# Patient Record
Sex: Male | Born: 1970 | Hispanic: Yes | Marital: Single | Smoking: Never smoker
Health system: Southern US, Community
[De-identification: ages and names within clinical notes are randomized; demographics above are authoritative.]

## PROBLEM LIST (undated history)

## (undated) DIAGNOSIS — K649 Unspecified hemorrhoids: Secondary | ICD-10-CM

## (undated) HISTORY — PX: HEMORRHOID SURGERY: SHX153

---

## 2010-10-16 ENCOUNTER — Ambulatory Visit (HOSPITAL_COMMUNITY): Admission: RE | Admit: 2010-10-16 | Discharge: 2010-10-16 | Payer: Self-pay | Admitting: Emergency Medicine

## 2015-06-07 ENCOUNTER — Emergency Department (HOSPITAL_COMMUNITY)
Admission: EM | Admit: 2015-06-07 | Discharge: 2015-06-07 | Disposition: A | Discharge status: Home / Self Care | Payer: Self-pay | Attending: Emergency Medicine | Admitting: Emergency Medicine

## 2015-06-07 ENCOUNTER — Encounter (HOSPITAL_COMMUNITY): Payer: Self-pay | Admitting: Emergency Medicine

## 2015-06-07 DIAGNOSIS — K644 Residual hemorrhoidal skin tags: Secondary | ICD-10-CM | POA: Insufficient documentation

## 2015-06-07 DIAGNOSIS — K611 Rectal abscess: Secondary | ICD-10-CM | POA: Insufficient documentation

## 2015-06-07 MED ORDER — SULFAMETHOXAZOLE-TRIMETHOPRIM 800-160 MG PO TABS
1.0000 | ORAL_TABLET | Freq: Once | ORAL | Status: AC
  Administered 2015-06-07: 1 via ORAL
  Filled 2015-06-07: qty 1

## 2015-06-07 MED ORDER — SULFAMETHOXAZOLE-TRIMETHOPRIM 800-160 MG PO TABS: 1.0000 | ORAL_TABLET | Freq: Two times a day (BID) | ORAL | Status: DC

## 2015-06-07 NOTE — Discharge Instructions (Signed)
As discussed, for the next 5 days that is important that he use Epsom salts baths, 3 times daily in addition to maintaining good hygiene practices.  Please take all medication as directed.  If your wound continues to enlarge, rather than decrease in size, please be sure to follow-up with our surgical colleagues.

## 2015-06-07 NOTE — ED Notes (Signed)
Pt. reports hemorhoid pain with swelling onset this week , denies rectal bleeding , no fever .

## 2015-06-08 NOTE — ED Provider Notes (Signed)
CSN: [id number]     Arrival date & time 06/07/15  2125 History   First MD Initiated Contact with Patient 06/07/15 2220     Chief Complaint  Patient presents with  . Hemorrhoids     (Consider location/radiation/quality/duration/timing/severity/associated sxs/prior Treatment) HPI  Patient presents with concern of perirectal irritation. Onset was about one week ago, since that time he has had a firm, irritated area to the right of his rectum. No fever, chills, changes in bowel movements. Patient acknowledges a history of hemorrhoids, denies any changes in those, specifically no bleeding, swelling. No abdominal pain. Patient says that he has a history of perirectal abscess in the distant past, has some concern about recurrence. He has not yet spoken with primary care or [facility]. Areas sore, not severe, moderate, no relief with OTC medication  History reviewed. No pertinent past medical history. Past Surgical History  Procedure Laterality Date  . Hemorrhoid surgery     No family history on file. History  Substance Use Topics  . Smoking status: Never Smoker   . Smokeless tobacco: Not on file  . Alcohol Use: No    Review of Systems  Constitutional: Negative for fever and chills.  Respiratory: Negative for shortness of breath.   Cardiovascular: Negative for chest pain.  Gastrointestinal: Negative for abdominal pain, blood in stool, abdominal distention and rectal pain.       Hemorrhoids, patient denies any active bleeding, swelling, rectal pain.  Musculoskeletal:       Negative aside from HPI  Skin:       Negative aside from HPI  Allergic/Immunologic: Negative for immunocompromised state.  Neurological: Negative for weakness.      Allergies  Review of patient's allergies indicates no known allergies.  Home Medications   Prior to Admission medications   Medication Sig Start Date End Date Taking? Authorizing Provider  ibuprofen (ADVIL,MOTRIN) 200 MG  tablet Take 400 mg by mouth every 6 (six) hours as needed for mild pain.   Yes Historical Provider, MD  OVER THE COUNTER MEDICATION Apply 1 application topically daily as needed (hemmroidal pain).   Yes Historical Provider, MD  sulfamethoxazole-trimethoprim (BACTRIM DS,SEPTRA DS) 800-160 MG per tablet Take 1 tablet by mouth 2 (two) times daily. 06/08/15 06/14/15  Gerhard Munchobert Merlyn Bollen, MD   BP 137/62 mmHg  Pulse 82  Temp(Src) 99 F (37.2 C)  Resp 16  SpO2 100% Physical Exam  Constitutional: He is oriented to person, place, and time. He appears well-developed. No distress.  HENT:  Head: Normocephalic and atraumatic.  Eyes: Conjunctivae and EOM are normal.  Cardiovascular: Normal rate and regular rhythm.   Pulmonary/Chest: Effort normal. No stridor. No respiratory distress.  Abdominal: He exhibits no distension.  Genitourinary: Rectal exam shows external hemorrhoid. Rectal exam shows no fissure.     Musculoskeletal: He exhibits no edema.  Neurological: He is alert and oriented to person, place, and time.  Skin: Skin is warm and dry.  Psychiatric: He has a normal mood and affect.  Nursing note and vitals reviewed.   ED Course  Procedures (including critical care time) Labs Review Labs Reviewed - No data to display  Imaging Review No results found.   EKG Interpretation None      MDM   Final diagnoses:  Peri-rectal abscess   Patient presents with a very early, by clinical exam, perirectal infection. No drainable collection. With a lengthy conversation about absence of baths, empiric antibiotics, close follow-up, probably with surgery. With no drainable collection, no evidence  for bacteremia or sepsis, patient was discharged in stable condition to follow-up as an outpatient.  Gerhard Munch, MD 06/08/15 [phone]

## 2015-06-11 ENCOUNTER — Inpatient Hospital Stay (HOSPITAL_COMMUNITY)
Admission: EM | Admit: 2015-06-11 | Discharge: 2015-06-13 | DRG: 395 | Disposition: A | Discharge status: Home Health | Payer: Medicaid Other | Attending: Surgery | Admitting: Surgery

## 2015-06-11 ENCOUNTER — Encounter (HOSPITAL_COMMUNITY): Payer: Self-pay | Admitting: Emergency Medicine

## 2015-06-11 ENCOUNTER — Emergency Department (HOSPITAL_COMMUNITY): Payer: Medicaid Other

## 2015-06-11 DIAGNOSIS — K611 Rectal abscess: Principal | ICD-10-CM | POA: Diagnosis present

## 2015-06-11 DIAGNOSIS — L0291 Cutaneous abscess, unspecified: Secondary | ICD-10-CM | POA: Diagnosis present

## 2015-06-11 HISTORY — DX: Unspecified hemorrhoids: K64.9

## 2015-06-11 LAB — CBC WITH DIFFERENTIAL/PLATELET
Basophils Absolute: 0 10*3/uL (ref 0.0–0.1)
Basophils Absolute: 0 10*3/uL (ref 0.0–0.1)
Basophils Relative: 0 % (ref 0–1)
Basophils Relative: 0 % (ref 0–1)
Eosinophils Absolute: 0.1 10*3/uL (ref 0.0–0.7)
Eosinophils Absolute: 0.1 10*3/uL (ref 0.0–0.7)
Eosinophils Relative: 1 % (ref 0–5)
Eosinophils Relative: 1 % (ref 0–5)
HCT: 44.2 % (ref 39.0–52.0)
HCT: 41.8 % (ref 39.0–52.0)
Hemoglobin: 15.3 g/dL (ref 13.0–17.0)
Hemoglobin: 14.2 g/dL (ref 13.0–17.0)
Lymphocytes Relative: 11 % — ABNORMAL LOW (ref 12–46)
Lymphocytes Relative: 15 % (ref 12–46)
Lymphs Abs: 1.4 10*3/uL (ref 0.7–4.0)
Lymphs Abs: 1.8 10*3/uL (ref 0.7–4.0)
MCH: 31.3 pg (ref 26.0–34.0)
MCH: 30.7 pg (ref 26.0–34.0)
MCHC: 34.6 g/dL (ref 30.0–36.0)
MCHC: 34 g/dL (ref 30.0–36.0)
MCV: 90.4 fL (ref 78.0–100.0)
MCV: 90.5 fL (ref 78.0–100.0)
MONOS PCT: 9 % (ref 3–12)
Monocytes Absolute: 1.1 10*3/uL — ABNORMAL HIGH (ref 0.1–1.0)
Monocytes Absolute: 1.1 10*3/uL — ABNORMAL HIGH (ref 0.1–1.0)
Monocytes Relative: 9 % (ref 3–12)
NEUTROS ABS: 10.1 10*3/uL — AB (ref 1.7–7.7)
NEUTROS PCT: 79 % — AB (ref 43–77)
Neutro Abs: 9.1 10*3/uL — ABNORMAL HIGH (ref 1.7–7.7)
Neutrophils Relative %: 75 % (ref 43–77)
PLATELETS: 351 10*3/uL (ref 150–400)
PLATELETS: 370 10*3/uL (ref 150–400)
RBC: 4.89 MIL/uL (ref 4.22–5.81)
RBC: 4.62 MIL/uL (ref 4.22–5.81)
RDW: 12.5 % (ref 11.5–15.5)
RDW: 12.5 % (ref 11.5–15.5)
WBC: 12.7 10*3/uL — ABNORMAL HIGH (ref 4.0–10.5)
WBC: 12.2 10*3/uL — ABNORMAL HIGH (ref 4.0–10.5)

## 2015-06-11 LAB — BASIC METABOLIC PANEL
ANION GAP: 10 (ref 5–15)
BUN: 9 mg/dL (ref 6–20)
CHLORIDE: 104 mmol/L (ref 101–111)
CO2: 26 mmol/L (ref 22–32)
CREATININE: 0.94 mg/dL (ref 0.61–1.24)
Calcium: 9.3 mg/dL (ref 8.9–10.3)
GFR calc Af Amer: >60 mL/min (ref >60)
GFR calc non Af Amer: >60 mL/min (ref >60)
Glucose, Bld: 103 mg/dL — ABNORMAL HIGH (ref 65–99)
Potassium: 4.8 mmol/L (ref 3.5–5.1)
SODIUM: 140 mmol/L (ref 135–145)

## 2015-06-11 MED ORDER — HYDROMORPHONE HCL 1 MG/ML IJ SOLN
1.0000 mg | Freq: Once | INTRAMUSCULAR | Status: AC
  Administered 2015-06-11: 1 mg via INTRAVENOUS
  Filled 2015-06-11: qty 1

## 2015-06-11 MED ORDER — VANCOMYCIN HCL IN DEXTROSE 1-5 GM/200ML-% IV SOLN
1000.0000 mg | Freq: Once | INTRAVENOUS | Status: AC
  Administered 2015-06-11: 1000 mg via INTRAVENOUS
  Filled 2015-06-11: qty 200

## 2015-06-11 MED ORDER — HYDROMORPHONE HCL 1 MG/ML IJ SOLN
1.0000 mg | INTRAMUSCULAR | Status: AC | PRN
  Administered 2015-06-11: 1 mg via INTRAVENOUS
  Filled 2015-06-11: qty 1

## 2015-06-11 MED ORDER — PROPOFOL 10 MG/ML IV BOLUS
INTRAVENOUS | Status: AC
  Filled 2015-06-11: qty 20

## 2015-06-11 MED ORDER — ONDANSETRON HCL 4 MG/2ML IJ SOLN
4.0000 mg | Freq: Once | INTRAMUSCULAR | Status: AC
  Administered 2015-06-11: 4 mg via INTRAVENOUS
  Filled 2015-06-11: qty 2

## 2015-06-11 MED ORDER — SODIUM CHLORIDE 0.9 % IV SOLN
INTRAVENOUS | Status: AC
  Administered 2015-06-11: 19:00:00 via INTRAVENOUS

## 2015-06-11 MED ORDER — SODIUM CHLORIDE 0.9 % IV SOLN
3.0000 g | Freq: Four times a day (QID) | INTRAVENOUS | Status: DC
  Administered 2015-06-11 – 2015-06-13 (×7): 3 g via INTRAVENOUS
  Filled 2015-06-11 (×9): qty 3

## 2015-06-11 MED ORDER — FENTANYL CITRATE (PF) 250 MCG/5ML IJ SOLN
INTRAMUSCULAR | Status: AC
  Filled 2015-06-11: qty 5

## 2015-06-11 MED ORDER — IOHEXOL 300 MG/ML  SOLN
100.0000 mL | Freq: Once | INTRAMUSCULAR | Status: AC | PRN
  Administered 2015-06-11: 100 mL via INTRAVENOUS

## 2015-06-11 MED ORDER — MIDAZOLAM HCL 2 MG/2ML IJ SOLN
INTRAMUSCULAR | Status: AC
  Filled 2015-06-11: qty 2

## 2015-06-11 MED ORDER — LIDOCAINE-EPINEPHRINE (PF) 2 %-1:200000 IJ SOLN
10.0000 mL | Freq: Once | INTRAMUSCULAR | Status: AC
  Administered 2015-06-11: 10 mL
  Filled 2015-06-11: qty 20

## 2015-06-11 MED ORDER — MORPHINE SULFATE 4 MG/ML IJ SOLN
4.0000 mg | INTRAMUSCULAR | Status: DC | PRN
  Administered 2015-06-12: 4 mg via INTRAVENOUS
  Filled 2015-06-11: qty 1

## 2015-06-11 MED ORDER — ONDANSETRON HCL 4 MG/2ML IJ SOLN: 4.0000 mg | Freq: Four times a day (QID) | INTRAMUSCULAR | Status: DC | PRN

## 2015-06-11 NOTE — Anesthesia Preprocedure Evaluation (Addendum)
Anesthesia Evaluation  Patient identified by MRN, date of birth, ID band Patient awake    Reviewed: Allergy & Precautions, NPO status , Patient's Chart, lab work & pertinent test results  Airway Mallampati: II  TM Distance: >3 FB Neck ROM: Full    Dental no notable dental hx.    Pulmonary neg pulmonary ROS,  breath sounds clear to auscultation  Pulmonary exam normal       Cardiovascular negative cardio ROS Normal cardiovascular examRhythm:Regular Rate:Normal     Neuro/Psych negative neurological ROS  negative psych ROS   GI/Hepatic negative GI ROS, Neg liver ROS,   Endo/Other  negative endocrine ROS  Renal/GU negative Renal ROS     Musculoskeletal negative musculoskeletal ROS (+)   Abdominal   Peds  Hematology negative hematology ROS (+)   Anesthesia Other Findings   Reproductive/Obstetrics                             Anesthesia Physical Anesthesia Plan  ASA: II  Anesthesia Plan: General   Post-op Pain Management:    Induction: Intravenous  Airway Management Planned: Oral ETT and LMA  Additional Equipment: None  Intra-op Plan:   Post-operative Plan: Extubation in OR  Informed Consent: I have reviewed the patients History and Physical, chart, labs and discussed the procedure including the risks, benefits and alternatives for the proposed anesthesia with the patient or authorized representative who has indicated his/her understanding and acceptance.   Dental advisory given  Plan Discussed with: CRNA  Anesthesia Plan Comments:        Anesthesia Quick Evaluation

## 2015-06-11 NOTE — ED Provider Notes (Signed)
THIS IS A SHARED VISIT WITH Hinton LovelyOBERT BROWING, [facility]  Patient awaiting surgeon. Dr. Corliss Skainssuei her to see the patient and it will be approximately 3 hours before he can go to the OR. He request consent form and start IV antibiotics. Will use language line to obtain consent and start Vancomycin 1 gram IV. Or will call when ready for patient. Patient resting comfortably and abscess is draining.  BP 139/72 mmHg  Pulse 80  Temp(Src) 98.2 F (36.8 C) (Oral)  Resp 16  Ht 5' 6.93" (1.7 m)  Wt 150 lb (68.04 kg)  BMI 23.54 kg/m2  SpO2 100%      [facility] Jurell Basista, NP 06/11/15 1645  Doug SouSam Jacubowitz, MD 06/11/15 1705

## 2015-06-11 NOTE — Progress Notes (Signed)
Patient ID: [name], male   DOB: [birth year], 44 y.o.   MRN: [id number]  Patient's abscess has spontaneously been draining.  Surgery has been delayed by multiple emergent trauma cases, so the case will be postponed until tomorrow.  Wilmon ArmsMatthew K. Corliss Skainssuei, MD, [facility]  General/ Trauma Surgery  06/11/2015 10:47 PM

## 2015-06-11 NOTE — ED Notes (Signed)
Pt reports having hemorrhoids for the past 5 years.He states there are 3 of them and they are very painful. No drainage noted by pt. He has had them removed one time before without any anesthesia and c/o severe pain. Pt is spanish speaking. Telephone interpreter used. Daughter at bedside.

## 2015-06-11 NOTE — ED Provider Notes (Addendum)
Patient appears uncomfortable Patient with draining abscess at right buttock. Approximately tennis ball sized. Tender.   Doug SouSam Kayani Rapaport, MD 06/11/15 1415 She was offered incision and drainage in the emergency department versus in the operating room he prefers to go to the operating room given had similar episode 5 years ago and he reported exquisite pain. I spoke with Dr.Tsuei plan 23 hour observation intravenous antibiotics nothing by mouth he will go to the operating room later today. Results for orders placed or performed during the hospital encounter of 06/11/15  CBC with Differential/Platelet  Result Value Ref Range   WBC 12.7 (H) 4.0 - 10.5 K/uL   RBC 4.89 4.22 - 5.81 MIL/uL   Hemoglobin 15.3 13.0 - 17.0 g/dL   HCT 09.844.2 11.939.0 - 14.752.0 %   MCV 90.4 78.0 - 100.0 fL   MCH 31.3 26.0 - 34.0 pg   MCHC 34.6 30.0 - 36.0 g/dL   RDW 82.912.5 56.211.5 - 13.015.5 %   Platelets 351 150 - 400 K/uL   Neutrophils Relative % 79 (H) 43 - 77 %   Neutro Abs 10.1 (H) 1.7 - 7.7 K/uL   Lymphocytes Relative 11 (L) 12 - 46 %   Lymphs Abs 1.4 0.7 - 4.0 K/uL   Monocytes Relative 9 3 - 12 %   Monocytes Absolute 1.1 (H) 0.1 - 1.0 K/uL   Eosinophils Relative 1 0 - 5 %   Eosinophils Absolute 0.1 0.0 - 0.7 K/uL   Basophils Relative 0 0 - 1 %   Basophils Absolute 0.0 0.0 - 0.1 K/uL  Basic metabolic panel  Result Value Ref Range   Sodium 140 135 - 145 mmol/L   Potassium 4.8 3.5 - 5.1 mmol/L   Chloride 104 101 - 111 mmol/L   CO2 26 22 - 32 mmol/L   Glucose, Bld 103 (H) 65 - 99 mg/dL   BUN 9 6 - 20 mg/dL   Creatinine, Ser 8.650.94 0.61 - 1.24 mg/dL   Calcium 9.3 8.9 - 78.410.3 mg/dL   GFR calc non Af Amer >60 >60 mL/min   GFR calc Af Amer >60 >60 mL/min   Anion gap 10 5 - 15   Ct Pelvis W Contrast  06/11/2015   CLINICAL DATA:  Rectal abscess, rectal pain for 1 week, foul-smelling drainage from rectal region  EXAM: CT PELVIS WITH CONTRAST  TECHNIQUE: Multidetector CT imaging of the pelvis was performed using the standard  protocol following the bolus administration of intravenous contrast. Sagittal and coronal MPR images reconstructed from axial data set.  CONTRAST:  100mL OMNIPAQUE IOHEXOL 300 MG/ML  SOLN IV  COMPARISON:  None.  FINDINGS: Bladder induced to ureters normal appearance.  Visualized bowel loops and portion of appendix normal appearance.  No acute intrapelvic abnormalities.  Infiltrative changes identified in the medial aspect of the RIGHT buttock compatible with an inflammatory process.  In addition, a focal gas and fluid collection with an enhancing margin is identified in the medial aspect of the RIGHT buttock compatible with abscess, overall measuring 5.0 x 2.0 x 6.2 cm in size.  No intrapelvic extension of this inflammatory process is identified.  No additional gas or fluid collections.  Small LEFT inguinal hernia.  Osseous structures unremarkable.  IMPRESSION: Soft tissue abscess identified at the medial aspect of RIGHT buttock 5.0 x 2.0 x 6.2 cm in size with surrounding inflammatory changes.  No intrapelvic extension of the inflammatory process is seen.  Small LEFT inguinal hernia.   Electronically Signed   By: Loraine LericheMark  Tyron Russell M.D.   On: 06/11/2015 13:42     Doug Sou, MD 06/11/15 [phone]

## 2015-06-11 NOTE — ED Notes (Signed)
Patient transported to CT 

## 2015-06-11 NOTE — H&P (Signed)
Joshua Patrick is an 44 y.o. male.   Chief Complaint: Buttock abscess  HPI: 44 yo hispanic male - speaks little [place] - presents with one week history of worsening pain and swelling on medial right buttock.  He was seen in the ED three days ago and was given Bactrim.  The swelling and pain has worsened, so he returned today.  Some foul smelling purulent drainage is coming out of the abscess now.  The ED PA ordered a pelvic CT scan.  Past Medical History  Diagnosis Date  . Hemorrhoids     Past Surgical History  Procedure Laterality Date  . Hemorrhoid surgery      No family history on file. Social History:  reports that he has never smoked. He does not have any smokeless tobacco history on file. He reports that he does not drink alcohol or use illicit drugs.  Allergies: No Known Allergies  Prior to Admission medications   Medication Sig Start Date End Date Taking? Authorizing Provider  ibuprofen (ADVIL,MOTRIN) 200 MG tablet Take 400 mg by mouth every 6 (six) hours as needed for mild pain.   Yes Historical Provider, MD  OVER THE COUNTER MEDICATION Apply 1 application topically daily as needed (hemmroidal pain).   Yes Historical Provider, MD  sulfamethoxazole-trimethoprim (BACTRIM DS,SEPTRA DS) 800-160 MG per tablet Take 1 tablet by mouth 2 (two) times daily. 06/08/15 06/14/15 Yes Carmin Muskrat, MD    Results for orders placed or performed during the hospital encounter of 06/11/15 (from the past 48 hour(s))  CBC with Differential/Platelet     Status: Abnormal   Collection Time: 06/11/15 12:44 PM  Result Value Ref Range   WBC 12.7 (H) 4.0 - 10.5 K/uL   RBC 4.89 4.22 - 5.81 MIL/uL   Hemoglobin 15.3 13.0 - 17.0 g/dL   HCT 44.2 39.0 - 52.0 %   MCV 90.4 78.0 - 100.0 fL   MCH 31.3 26.0 - 34.0 pg   MCHC 34.6 30.0 - 36.0 g/dL   RDW 12.5 11.5 - 15.5 %   Platelets 351 150 - 400 K/uL   Neutrophils Relative % 79 (H) 43 - 77 %   Neutro Abs 10.1 (H) 1.7 - 7.7 K/uL   Lymphocytes Relative  11 (L) 12 - 46 %   Lymphs Abs 1.4 0.7 - 4.0 K/uL   Monocytes Relative 9 3 - 12 %   Monocytes Absolute 1.1 (H) 0.1 - 1.0 K/uL   Eosinophils Relative 1 0 - 5 %   Eosinophils Absolute 0.1 0.0 - 0.7 K/uL   Basophils Relative 0 0 - 1 %   Basophils Absolute 0.0 0.0 - 0.1 K/uL  Basic metabolic panel     Status: Abnormal   Collection Time: 06/11/15 12:44 PM  Result Value Ref Range   Sodium 140 135 - 145 mmol/L   Potassium 4.8 3.5 - 5.1 mmol/L   Chloride 104 101 - 111 mmol/L   CO2 26 22 - 32 mmol/L   Glucose, Bld 103 (H) 65 - 99 mg/dL   BUN 9 6 - 20 mg/dL   Creatinine, Ser 0.94 0.61 - 1.24 mg/dL   Calcium 9.3 8.9 - 10.3 mg/dL   GFR calc non Af Amer >60 >60 mL/min   GFR calc Af Amer >60 >60 mL/min    Comment: (NOTE) The eGFR has been calculated using the CKD EPI equation. This calculation has not been validated in all clinical situations. eGFR's persistently <60 mL/min signify possible Chronic Kidney Disease.    Anion gap  10 5 - 15   Ct Pelvis W Contrast  06/11/2015   CLINICAL DATA:  Rectal abscess, rectal pain for 1 week, foul-smelling drainage from rectal region  EXAM: CT PELVIS WITH CONTRAST  TECHNIQUE: Multidetector CT imaging of the pelvis was performed using the standard protocol following the bolus administration of intravenous contrast. Sagittal and coronal MPR images reconstructed from axial data set.  CONTRAST:  110m OMNIPAQUE IOHEXOL 300 MG/ML  SOLN IV  COMPARISON:  None.  FINDINGS: Bladder induced to ureters normal appearance.  Visualized bowel loops and portion of appendix normal appearance.  No acute intrapelvic abnormalities.  Infiltrative changes identified in the medial aspect of the RIGHT buttock compatible with an inflammatory process.  In addition, a focal gas and fluid collection with an enhancing margin is identified in the medial aspect of the RIGHT buttock compatible with abscess, overall measuring 5.0 x 2.0 x 6.2 cm in size.  No intrapelvic extension of this inflammatory  process is identified.  No additional gas or fluid collections.  Small LEFT inguinal hernia.  Osseous structures unremarkable.  IMPRESSION: Soft tissue abscess identified at the medial aspect of RIGHT buttock 5.0 x 2.0 x 6.2 cm in size with surrounding inflammatory changes.  No intrapelvic extension of the inflammatory process is seen.  Small LEFT inguinal hernia.   Electronically Signed   By: MLavonia DanaM.D.   On: 06/11/2015 13:42    Review of Systems  Constitutional: Negative for weight loss.  HENT: Negative for ear discharge, ear pain, hearing loss and tinnitus.   Eyes: Negative for blurred vision, double vision, photophobia and pain.  Respiratory: Negative for cough, sputum production and shortness of breath.   Cardiovascular: Negative for chest pain.  Gastrointestinal: Negative for nausea, vomiting and abdominal pain.  Genitourinary: Negative for dysuria, urgency, frequency and flank pain.  Musculoskeletal: Negative for myalgias, back pain, joint pain, falls and neck pain.  Neurological: Negative for dizziness, tingling, sensory change, focal weakness, loss of consciousness and headaches.  Endo/Heme/Allergies: Does not bruise/bleed easily.  Psychiatric/Behavioral: Negative for depression, memory loss and substance abuse. The patient is not nervous/anxious.     Blood pressure 139/72, pulse 80, temperature 98.2 F (36.8 C), temperature source Oral, resp. rate 16, height 5' 6.93" (1.7 m), weight 68.04 kg (150 lb), SpO2 100 %. Physical Exam  WDWN - uncomfortable Right medial buttock - 6 cm area of fluctuance with central pinpoint opening - draining foul-smelling brownish pus.  Very tender to palpation.  Assessment/Plan Right buttock abscess.  Recommend incision and drainage.  I spoke with the ED physician.  Due to emergency scheduling, it will be 3-4 hours before I can perform his drainage.  If the EDP is able to drain this adequately, then the wound can be packed with daily dressing  changes.  Otherwise, if he can wait, we can proceed with operative incision and drainage of the buttock abscess under general anesthesia.    Elissa Grieshop K. 06/11/2015, 4:39 PM

## 2015-06-11 NOTE — ED Provider Notes (Signed)
CSN: [id number]     Arrival date & time 06/11/15  1137 History  This chart was scribed for non-physician practitioner, Roxy Horseman, PA-C, working with Doug Sou, MD by Charline Bills, ED Scribe. This patient was seen in room TR11C/TR11C and the patient's care was started at 12:10 PM.   No chief complaint on file.  The history is provided by the patient. The history is limited by a language barrier. A language interpreter was used.   HPI Comments: Joshua Patrick is a 44 y.o. male who presents to the Emergency Department with a chief complaint of a painful abscess to the right buttock for the past week. Pt reports persistent pain that is exacerbated with palpation and pressure. He was seen in the ED on 06/08/15 for the same and was discharged with Bactrim which has not provided any relief. He also reports h/o hemorrhoids.   Hx obtained via professional medical interpreter.  Past Medical History  Diagnosis Date  . Hemorrhoids    Past Surgical History  Procedure Laterality Date  . Hemorrhoid surgery     No family history on file. History  Substance Use Topics  . Smoking status: Never Smoker   . Smokeless tobacco: Not on file  . Alcohol Use: No    Review of Systems  Constitutional: Negative for fever.  Skin:       + Abscess   Allergies  Review of patient's allergies indicates no known allergies.  Home Medications   Prior to Admission medications   Medication Sig Start Date End Date Taking? Authorizing Provider  ibuprofen (ADVIL,MOTRIN) 200 MG tablet Take 400 mg by mouth every 6 (six) hours as needed for mild pain.    Historical Provider, MD  OVER THE COUNTER MEDICATION Apply 1 application topically daily as needed (hemmroidal pain).    Historical Provider, MD  sulfamethoxazole-trimethoprim (BACTRIM DS,SEPTRA DS) 800-160 MG per tablet Take 1 tablet by mouth 2 (two) times daily. 06/08/15 06/14/15  Gerhard Munch, MD   BP 122/62 mmHg  Pulse 89  Temp(Src) 98.4 F (36.9  C) (Oral)  Resp 16  Ht 5' 6.93" (1.7 m)  Wt 150 lb (68.04 kg)  BMI 23.54 kg/m2  SpO2 98% Physical Exam  Constitutional: He is oriented to person, place, and time. He appears well-developed and well-nourished. No distress.  HENT:  Head: Normocephalic and atraumatic.  Eyes: Conjunctivae and EOM are normal.  Neck: Neck supple. No tracheal deviation present.  Cardiovascular: Normal rate.   Pulmonary/Chest: Effort normal. No respiratory distress.  Musculoskeletal: Normal range of motion.  Neurological: He is alert and oriented to person, place, and time.  Skin: Skin is warm and dry.  Chaperone present for exam. Large tennis ball sized area of induration of the R buttock approaching the anus. Moderate purulent discharge extending from several locations.   Psychiatric: He has a normal mood and affect. His behavior is normal.  Nursing note and vitals reviewed.  ED Course  Procedures (including critical care time) DIAGNOSTIC STUDIES: Oxygen Saturation is 98% on RA, normal by my interpretation.    COORDINATION OF CARE: 12:17 PM-Discussed treatment plan which includes diagnostic lab work, CT pelvis, Dilaudid injection, Omnipaque and Zofran with pt at bedside and pt agreed to plan.   Labs Review Labs Reviewed  CBC WITH DIFFERENTIAL/PLATELET - Abnormal; Notable for the following:    WBC 12.7 (*)    Neutrophils Relative % 79 (*)    Neutro Abs 10.1 (*)    Lymphocytes Relative 11 (*)    Monocytes  Absolute 1.1 (*)    All other components within normal limits  BASIC METABOLIC PANEL - Abnormal; Notable for the following:    Glucose, Bld 103 (*)    All other components within normal limits   Imaging Review Ct Pelvis W Contrast  06/11/2015   CLINICAL DATA:  Rectal abscess, rectal pain for 1 week, foul-smelling drainage from rectal region  EXAM: CT PELVIS WITH CONTRAST  TECHNIQUE: Multidetector CT imaging of the pelvis was performed using the standard protocol following the bolus administration  of intravenous contrast. Sagittal and coronal MPR images reconstructed from axial data set.  CONTRAST:  100mL OMNIPAQUE IOHEXOL 300 MG/ML  SOLN IV  COMPARISON:  None.  FINDINGS: Bladder induced to ureters normal appearance.  Visualized bowel loops and portion of appendix normal appearance.  No acute intrapelvic abnormalities.  Infiltrative changes identified in the medial aspect of the RIGHT buttock compatible with an inflammatory process.  In addition, a focal gas and fluid collection with an enhancing margin is identified in the medial aspect of the RIGHT buttock compatible with abscess, overall measuring 5.0 x 2.0 x 6.2 cm in size.  No intrapelvic extension of this inflammatory process is identified.  No additional gas or fluid collections.  Small LEFT inguinal hernia.  Osseous structures unremarkable.  IMPRESSION: Soft tissue abscess identified at the medial aspect of RIGHT buttock 5.0 x 2.0 x 6.2 cm in size with surrounding inflammatory changes.  No intrapelvic extension of the inflammatory process is seen.  Small LEFT inguinal hernia.   Electronically Signed   By: Ulyses SouthwardMark  Boles M.D.   On: 06/11/2015 13:42    EKG Interpretation None      MDM   Final diagnoses:  Abscess    2:14 PM CT reviewed with Dr. Ethelda ChickJacubowitz.  Patient seen by and discussed with Dr. Ethelda ChickJacubowitz, who recommends consultation with general surgery for further management of abscess.  3:58 PM Patient discussed with Dr. Corliss Skainssuei, who states that he has another case before he can see the patient, but will see the patient in a few hours.  Discussed this plan with Dr. Ethelda ChickJacubowitz, who recommends waiting for Dr. Corliss Skainssuei to take patient to OR.  Informed patient that this will be 3-4 hours.  Patient understands and agrees with the plan.  Oncoming provider aware of plan for Dr. Corliss Skainssuei to see patient when able.  I personally performed the services described in this documentation, which was scribed in my presence. The recorded information has been  reviewed and is accurate.     Roxy HorsemanRobert Kael Keetch, PA-C 06/11/15 1616  Doug SouSam Jacubowitz, MD 06/11/15 1705

## 2015-06-12 ENCOUNTER — Observation Stay (HOSPITAL_COMMUNITY): Payer: Medicaid Other | Admitting: Anesthesiology

## 2015-06-12 ENCOUNTER — Encounter (HOSPITAL_COMMUNITY): Payer: Self-pay | Admitting: Certified Registered Nurse Anesthetist

## 2015-06-12 ENCOUNTER — Encounter (HOSPITAL_COMMUNITY): Admission: EM | Disposition: A | Discharge status: Home Health | Payer: Self-pay | Source: Home / Self Care

## 2015-06-12 DIAGNOSIS — L0291 Cutaneous abscess, unspecified: Secondary | ICD-10-CM | POA: Diagnosis present

## 2015-06-12 DIAGNOSIS — K611 Rectal abscess: Secondary | ICD-10-CM | POA: Diagnosis present

## 2015-06-12 HISTORY — PX: IRRIGATION AND DEBRIDEMENT BUTTOCKS: SHX6601

## 2015-06-12 LAB — SURGICAL PCR SCREEN
MRSA, PCR: NEGATIVE
STAPHYLOCOCCUS AUREUS: NEGATIVE

## 2015-06-12 SURGERY — IRRIGATION AND DEBRIDEMENT BUTTOCKS
Anesthesia: General | Site: Buttocks | Laterality: Right

## 2015-06-12 MED ORDER — PROMETHAZINE HCL 25 MG/ML IJ SOLN
6.2500 mg | INTRAMUSCULAR | Status: DC | PRN
Start: 2015-06-12 — End: 2015-06-12

## 2015-06-12 MED ORDER — FENTANYL CITRATE (PF) 100 MCG/2ML IJ SOLN
INTRAMUSCULAR | Status: DC | PRN
  Administered 2015-06-12: 25 ug via INTRAVENOUS
  Administered 2015-06-12 (×2): 50 ug via INTRAVENOUS
  Administered 2015-06-12: 25 ug via INTRAVENOUS

## 2015-06-12 MED ORDER — MEPERIDINE HCL 25 MG/ML IJ SOLN: 6.2500 mg | INTRAMUSCULAR | Status: DC | PRN

## 2015-06-12 MED ORDER — SODIUM CHLORIDE 0.9 % IV SOLN
INTRAVENOUS | Status: DC | PRN
  Administered 2015-06-12: 09:00:00 via INTRAVENOUS

## 2015-06-12 MED ORDER — ARTIFICIAL TEARS OP OINT
TOPICAL_OINTMENT | OPHTHALMIC | Status: AC
  Filled 2015-06-12: qty 3.5

## 2015-06-12 MED ORDER — ROCURONIUM BROMIDE 50 MG/5ML IV SOLN
INTRAVENOUS | Status: AC
  Filled 2015-06-12: qty 1

## 2015-06-12 MED ORDER — ONDANSETRON HCL 4 MG/2ML IJ SOLN
INTRAMUSCULAR | Status: DC | PRN
Start: 2015-06-12 — End: 2015-06-12
  Administered 2015-06-12: 4 mg via INTRAVENOUS

## 2015-06-12 MED ORDER — PROPOFOL 10 MG/ML IV BOLUS
INTRAVENOUS | Status: AC
  Filled 2015-06-12: qty 20

## 2015-06-12 MED ORDER — BUPIVACAINE-EPINEPHRINE (PF) 0.25% -1:200000 IJ SOLN
INTRAMUSCULAR | Status: AC
  Filled 2015-06-12: qty 30

## 2015-06-12 MED ORDER — PROPOFOL 10 MG/ML IV BOLUS
INTRAVENOUS | Status: DC | PRN
  Administered 2015-06-12: 200 mg via INTRAVENOUS

## 2015-06-12 MED ORDER — LIDOCAINE HCL (CARDIAC) 20 MG/ML IV SOLN
INTRAVENOUS | Status: AC
  Filled 2015-06-12: qty 5

## 2015-06-12 MED ORDER — HYDROMORPHONE HCL 1 MG/ML IJ SOLN
0.2500 mg | INTRAMUSCULAR | Status: DC | PRN
  Administered 2015-06-12 (×2): 0.5 mg via INTRAVENOUS

## 2015-06-12 MED ORDER — ONDANSETRON HCL 4 MG/2ML IJ SOLN
INTRAMUSCULAR | Status: AC
  Filled 2015-06-12: qty 2

## 2015-06-12 MED ORDER — SURGILUBE EX GEL
CUTANEOUS | Status: DC | PRN
  Administered 2015-06-12: 1 via TOPICAL

## 2015-06-12 MED ORDER — STERILE WATER FOR INJECTION IJ SOLN
INTRAMUSCULAR | Status: AC
  Filled 2015-06-12: qty 10

## 2015-06-12 MED ORDER — DIBUCAINE 1 % RE OINT
TOPICAL_OINTMENT | RECTAL | Status: AC
  Filled 2015-06-12: qty 28

## 2015-06-12 MED ORDER — SUCCINYLCHOLINE CHLORIDE 20 MG/ML IJ SOLN
INTRAMUSCULAR | Status: AC
  Filled 2015-06-12: qty 1

## 2015-06-12 MED ORDER — EPHEDRINE SULFATE 50 MG/ML IJ SOLN
INTRAMUSCULAR | Status: AC
  Filled 2015-06-12: qty 1

## 2015-06-12 MED ORDER — 0.9 % SODIUM CHLORIDE (POUR BTL) OPTIME
TOPICAL | Status: DC | PRN
  Administered 2015-06-12: 1000 mL

## 2015-06-12 MED ORDER — MIDAZOLAM HCL 2 MG/2ML IJ SOLN
INTRAMUSCULAR | Status: AC
  Filled 2015-06-12: qty 2

## 2015-06-12 MED ORDER — BUPIVACAINE-EPINEPHRINE (PF) 0.5% -1:200000 IJ SOLN
INTRAMUSCULAR | Status: AC
  Filled 2015-06-12: qty 30

## 2015-06-12 MED ORDER — HYDROMORPHONE HCL 1 MG/ML IJ SOLN
INTRAMUSCULAR | Status: AC
  Filled 2015-06-12: qty 1

## 2015-06-12 MED ORDER — HYDROMORPHONE HCL 1 MG/ML IJ SOLN: 0.2500 mg | INTRAMUSCULAR | Status: DC | PRN

## 2015-06-12 MED ORDER — BUPIVACAINE-EPINEPHRINE (PF) 0.25% -1:200000 IJ SOLN
INTRAMUSCULAR | Status: DC | PRN
  Administered 2015-06-12: 10 mL

## 2015-06-12 MED ORDER — MIDAZOLAM HCL 5 MG/5ML IJ SOLN
INTRAMUSCULAR | Status: DC | PRN
  Administered 2015-06-12: 2 mg via INTRAVENOUS

## 2015-06-12 MED ORDER — FENTANYL CITRATE (PF) 250 MCG/5ML IJ SOLN
INTRAMUSCULAR | Status: AC
  Filled 2015-06-12: qty 5

## 2015-06-12 SURGICAL SUPPLY — 49 items
CANISTER SUCTION 2500CC (MISCELLANEOUS) ×3 IMPLANT
COVER MAYO STAND STRL (DRAPES) ×3 IMPLANT
COVER SURGICAL LIGHT HANDLE (MISCELLANEOUS) ×3 IMPLANT
DECANTER SPIKE VIAL GLASS SM (MISCELLANEOUS) IMPLANT
DRAPE UTILITY XL STRL (DRAPES) ×6 IMPLANT
DRSG PAD ABDOMINAL 8X10 ST (GAUZE/BANDAGES/DRESSINGS) ×3 IMPLANT
ELECT CAUTERY BLADE 6.4 (BLADE) ×3 IMPLANT
ELECT REM PT RETURN 9FT ADLT (ELECTROSURGICAL) ×3
ELECTRODE REM PT RTRN 9FT ADLT (ELECTROSURGICAL) ×1 IMPLANT
GAUZE IODOFORM PACK 1/2 7832 (GAUZE/BANDAGES/DRESSINGS) ×3 IMPLANT
GAUZE SPONGE 4X4 12PLY STRL (GAUZE/BANDAGES/DRESSINGS) ×3 IMPLANT
GAUZE SPONGE 4X4 16PLY XRAY LF (GAUZE/BANDAGES/DRESSINGS) ×3 IMPLANT
GLOVE BIO SURGEON STRL SZ7.5 (GLOVE) ×6 IMPLANT
GLOVE BIOGEL PI IND STRL 7.0 (GLOVE) ×1 IMPLANT
GLOVE BIOGEL PI IND STRL 7.5 (GLOVE) ×1 IMPLANT
GLOVE BIOGEL PI IND STRL 8 (GLOVE) ×1 IMPLANT
GLOVE BIOGEL PI INDICATOR 7.0 (GLOVE) ×2
GLOVE BIOGEL PI INDICATOR 7.5 (GLOVE) ×2
GLOVE BIOGEL PI INDICATOR 8 (GLOVE) ×2
GLOVE SURG SS PI 7.0 STRL IVOR (GLOVE) ×3 IMPLANT
GOWN STRL REUS W/ TWL LRG LVL3 (GOWN DISPOSABLE) ×1 IMPLANT
GOWN STRL REUS W/ TWL XL LVL3 (GOWN DISPOSABLE) ×1 IMPLANT
GOWN STRL REUS W/TWL LRG LVL3 (GOWN DISPOSABLE) ×2
GOWN STRL REUS W/TWL XL LVL3 (GOWN DISPOSABLE) ×2
KIT BASIN OR (CUSTOM PROCEDURE TRAY) ×3 IMPLANT
KIT ROOM TURNOVER OR (KITS) ×3 IMPLANT
LOOP VESSEL MAXI BLUE (MISCELLANEOUS) IMPLANT
NEEDLE HYPO 25GX1X1/2 BEV (NEEDLE) ×3 IMPLANT
NS IRRIG 1000ML POUR BTL (IV SOLUTION) ×3 IMPLANT
PACK LITHOTOMY IV (CUSTOM PROCEDURE TRAY) ×3 IMPLANT
PAD ARMBOARD 7.5X6 YLW CONV (MISCELLANEOUS) ×6 IMPLANT
PENCIL BUTTON HOLSTER BLD 10FT (ELECTRODE) ×3 IMPLANT
SHEARS HARMONIC 9CM CVD (BLADE) IMPLANT
SPECIMEN JAR SMALL (MISCELLANEOUS) IMPLANT
SPONGE HEMORRHOID 8X3CM (HEMOSTASIS) IMPLANT
SURGILUBE 2OZ TUBE FLIPTOP (MISCELLANEOUS) ×3 IMPLANT
SUT CHROMIC 2 0 SH (SUTURE) IMPLANT
SUT CHROMIC 3 0 SH 27 (SUTURE) IMPLANT
SWAB COLLECTION DEVICE MRSA (MISCELLANEOUS) ×3 IMPLANT
SYR BULB 3OZ (MISCELLANEOUS) ×3 IMPLANT
SYR CONTROL 10ML LL (SYRINGE) ×3 IMPLANT
TAPE CLOTH SURG 4X10 WHT LF (GAUZE/BANDAGES/DRESSINGS) ×3 IMPLANT
TOWEL OR 17X24 6PK STRL BLUE (TOWEL DISPOSABLE) ×3 IMPLANT
TOWEL OR 17X26 10 PK STRL BLUE (TOWEL DISPOSABLE) ×3 IMPLANT
TUBE ANAEROBIC SPECIMEN COL (MISCELLANEOUS) ×3 IMPLANT
TUBE CONNECTING 12'X1/4 (SUCTIONS) ×1
TUBE CONNECTING 12X1/4 (SUCTIONS) ×2 IMPLANT
WATER STERILE IRR 1000ML POUR (IV SOLUTION) IMPLANT
YANKAUER SUCT BULB TIP NO VENT (SUCTIONS) ×3 IMPLANT

## 2015-06-12 NOTE — Anesthesia Procedure Notes (Signed)
Procedure Name: LMA Insertion Date/Time: 06/12/2015 8:41 AM Performed by: Margaree MackintoshYACOUB, Barclay Lennox B Pre-anesthesia Checklist: Patient identified, Emergency Drugs available, Suction available, Patient being monitored and Timeout performed Patient Re-evaluated:Patient Re-evaluated prior to inductionOxygen Delivery Method: Circle system utilized Preoxygenation: Pre-oxygenation with 100% oxygen Intubation Type: IV induction Ventilation: Mask ventilation without difficulty LMA: LMA inserted LMA Size: 5.0 Number of attempts: 1 Placement Confirmation: positive ETCO2 and breath sounds checked- equal and bilateral Tube secured with: Tape Dental Injury: Teeth and Oropharynx as per pre-operative assessment

## 2015-06-12 NOTE — Anesthesia Postprocedure Evaluation (Signed)
  Anesthesia Post-op Note  Patient: Joshua Patrick  Procedure(s) Performed: Procedure(s): IRRIGATION AND DEBRIDEMENT BUTTOCK ABSCESS (Right)  Patient Location: PACU  Anesthesia Type:General  Level of Consciousness: awake and alert   Airway and Oxygen Therapy: Patient Spontanous Breathing  Post-op Pain: Controlled  Post-op Assessment: Post-op Vital signs reviewed, Patient's Cardiovascular Status Stable and Respiratory Function Stable  Post-op Vital Signs: Reviewed  Filed Vitals:   06/12/15 1000  BP:   Pulse: 61  Temp:   Resp: 10    Complications: No apparent anesthesia complications

## 2015-06-12 NOTE — Transfer of Care (Signed)
Immediate Anesthesia Transfer of Care Note  Patient: Joshua Patrick  Procedure(s) Performed: Procedure(s): IRRIGATION AND DEBRIDEMENT BUTTOCK ABSCESS (Right)  Patient Location: PACU  Anesthesia Type:General  Level of Consciousness: awake, alert  and oriented  Airway & Oxygen Therapy: Patient Spontanous Breathing and Patient connected to nasal cannula oxygen  Post-op Assessment: Report given to RN and Post -op Vital signs reviewed and stable  Post vital signs: Reviewed and stable  Last Vitals:  Filed Vitals:   06/12/15 0614  BP: 110/77  Pulse: 64  Temp: 36.9 C  Resp: 16    Complications: No apparent anesthesia complications

## 2015-06-12 NOTE — Op Note (Signed)
06/12/2015  9:03 AM  PATIENT:  [name]  44 y.o. male  PRE-OPERATIVE DIAGNOSIS:  Buttocks abscess  POST-OPERATIVE DIAGNOSIS:  Buttocks abscess  PROCEDURE:  Procedure(s): Incision and drainage of gluteal abscess  SURGEON:  Surgeon(s) and Role:    * Axel FillerArmando Gillian Meeuwsen, MD - Primary  ANESTHESIA:   local and general  EBL:   20cc  BLOOD ADMINISTERED:none  DRAINS: 1.5in iodoform gauze   LOCAL MEDICATIONS USED:  BUPIVICAINE   SPECIMEN:  Source of Specimen:  Gluteal abscess  DISPOSITION OF SPECIMEN:  micro  COUNTS:  YES  TOURNIQUET:  * No tourniquets in log *  DICTATION: .Dragon Dictation After the patient was consented he was taken back to the OR and placed in supine position with bilateral SCDs in place. He was then positioned in the lithotomy position. Patient was prepped and draped in usual fashion.  The right gluteal abscess was incised and the elliptical fashion approximately a 2 cm area of skin was excised. The aerobic and anaerobic cultures were then taken. The abscess cavity was bluntly explored. All loculations were broken up. The abscess tracked anteromedially. This tendon was irrigated out with sterile saline. There is packed with half-inch iodoform gauze. The wound was dressed with 4 x 4's, AVD pad, and mesh panties.   All counts were reported as correct 2. The patient was taken to the recovery room in stable condition.  PLAN OF CARE: Admitted to inpt  PATIENT DISPOSITION:  PACU - hemodynamically stable.   Delay start of Pharmacological VTE agent (>24hrs) due to surgical blood loss or risk of bleeding: not applicable

## 2015-06-13 ENCOUNTER — Encounter (HOSPITAL_COMMUNITY): Payer: Self-pay | Admitting: General Surgery

## 2015-06-13 MED ORDER — OXYCODONE-ACETAMINOPHEN 5-325 MG PO TABS: 1.0000 | ORAL_TABLET | ORAL | Status: DC | PRN

## 2015-06-13 MED ORDER — AMOXICILLIN-POT CLAVULANATE 875-125 MG PO TABS: 1.0000 | ORAL_TABLET | Freq: Two times a day (BID) | ORAL | Status: DC

## 2015-06-13 MED ORDER — AMOXICILLIN-POT CLAVULANATE 875-125 MG PO TABS
1.0000 | ORAL_TABLET | Freq: Two times a day (BID) | ORAL | Status: DC
  Administered 2015-06-13: 1 via ORAL
  Filled 2015-06-13: qty 1

## 2015-06-13 MED ORDER — OXYCODONE-ACETAMINOPHEN 5-325 MG PO TABS
1.0000 | ORAL_TABLET | ORAL | Status: DC | PRN
  Administered 2015-06-13: 2 via ORAL
  Filled 2015-06-13: qty 2

## 2015-06-13 NOTE — Discharge Instructions (Signed)
Dressing Change two times a day May shower, but remove packing before you get in the shower A dressing is a material placed over wounds. It keeps the wound clean, dry, and protected from further injury. This provides an environment that favors wound healing.  BEFORE YOU BEGIN  Get your supplies together. Things you may need include:  Saline solution.  Flexible gauze dressing.  Medicated cream.  Tape.  Gloves.  Abdominal dressing pads.  Gauze squares.  Plastic bags.  Take pain medicine 30 minutes before the dressing change if you need it.  Take a shower before you do the first dressing change of the day. Use plastic wrap or a plastic bag to prevent the dressing from getting wet. REMOVING YOUR OLD DRESSING   Wash your hands with soap and water. Dry your hands with a clean towel.  Put on your gloves.  Remove any tape.  Carefully remove the old dressing. If the dressing sticks, you may dampen it with warm water to loosen it, or follow your caregiver's specific directions.  Remove any gauze or packing tape that is in your wound.  Take off your gloves.  Put the gloves, tape, gauze, or any packing tape into a plastic bag. CHANGING YOUR DRESSING  Open the supplies.  Take the cap off the saline solution.  Open the gauze package so that the gauze remains on the inside of the package.  Put on your gloves.  Clean your wound as told by your caregiver.  If you have been told to keep your wound dry, follow those instructions.  Your caregiver may tell you to do one or more of the following:  Pick up the gauze. Pour the saline solution over the gauze. Squeeze out the extra saline solution.  Put medicated cream or other medicine on your wound if you have been told to do so.  Put the solution soaked gauze only in your wound, not on the skin around it.  Pack your wound loosely or as told by your caregiver.  Put dry gauze on your wound.  Put abdominal dressing pads over  the dry gauze if your wet gauze soaks through.  Tape the abdominal dressing pads in place so they will not fall off. Do not wrap the tape completely around the affected part (arm, leg, abdomen).  Wrap the dressing pads with a flexible gauze dressing to secure it in place.  Take off your gloves. Put them in the plastic bag with the old dressing. Tie the bag shut and throw it away.  Keep the dressing clean and dry until your next dressing change.  Wash your hands. SEEK MEDICAL CARE IF:  Your skin around the wound looks red.  Your wound feels more tender or sore.  You see pus in the wound.  Your wound smells bad.  You have a fever.  Your skin around the wound has a rash that itches and burns.  You see black or yellow skin in your wound that was not there before.  You feel nauseous, throw up, and feel very tired. Document Released: 01/01/2005 Document Revised: 02/16/2012 Document Reviewed: 10/06/2011 [facility] Patient Information 2015 [place] [place]. This information is not intended to replace advice given to you by your health care provider. Make sure you discuss any questions you have with your health care provider.

## 2015-06-13 NOTE — Progress Notes (Signed)
Interpreter Wyvonnia DuskyGraciela Namihira for [facility] RN discharge instructions. [facility]. [facility] Care management .

## 2015-06-13 NOTE — Discharge Summary (Signed)
Patient ID: Joshua RaringWilliam Patrick MRN: [id number] DOB/AGE: 12-29-1970 44 y.o.  Admit date: 06/11/2015 Discharge date: 06/13/2015  Procedures: incision and drainage of perirectal abscess  Consults: None  Reason for Admission: 44 yo hispanic male - speaks little [place] - presents with one week history of worsening pain and swelling on medial right buttock. He was seen in the ED three days ago and was given Bactrim. The swelling and pain has worsened, so he returned today. Some foul smelling purulent drainage is coming out of the abscess now. The ED PA ordered a pelvic CT scan.  Admission Diagnoses:  1. Perirectal abscess  Hospital Course: The patient was admitted and started on IV abx therapy.  He was taken to the OR where he underwent the above procedure.  He tolerated this well and was stable for dc home the next day after starting his dressing changes.  HH was arranged to assist him with this.  Discharge Diagnoses:  Active Problems:   Abscess s/p I&D of perirectal abscess  Discharge Medications:   Medication List    STOP taking these medications        sulfamethoxazole-trimethoprim 800-160 MG per tablet  Commonly known as:  BACTRIM DS,SEPTRA DS      TAKE these medications        amoxicillin-clavulanate 875-125 MG per tablet  Commonly known as:  AUGMENTIN  Take 1 tablet by mouth every 12 (twelve) hours.     ibuprofen 200 MG tablet  Commonly known as:  ADVIL,MOTRIN  Take 400 mg by mouth every 6 (six) hours as needed for mild pain.     OVER THE COUNTER MEDICATION  Apply 1 application topically daily as needed (hemmroidal pain).     oxyCODONE-acetaminophen 5-325 MG per tablet  Commonly known as:  PERCOCET/ROXICET  Take 1-2 tablets by mouth every 4 (four) hours as needed for moderate pain.        Discharge Instructions:     Follow-up Information    Follow up with [facility] On 06/26/2015.   Specialty:  General Surgery   Why:  [facility],  1:30pm, arrive no later than 1:00pm for paperwork   Contact information:   [address] [place] [address] [phone]       Signed: Letha CapeOSBORNE,Waldo Damian E 06/13/2015, 10:01 AM

## 2015-06-13 NOTE — Care Management Note (Signed)
Case Management Note  Patient Details  Name: Joshua Patrick MRN: [id number] Date of Birth: 03/20/1971  Subjective/Objective:                    Action/Plan: MATCH letter and home health explained via  Orson ApeInterperter Graciela Namihira    Expected Discharge Date:                  Expected Discharge Plan:  Home w Home Health Services  In-House Referral:     Discharge planning Services  CM Consult, MATCH Program, Medication Assistance  Post Acute Care Choice:  Home Health Choice offered to:  Patient  DME Arranged:    DME Agency:     HH Arranged:  RN HH Agency:  [facility]  Status of Service:  Completed, signed off  Medicare Important Message Given:    Date Medicare IM Given:    Medicare IM give by:    Date Additional Medicare IM Given:    Additional Medicare Important Message give by:     If discussed at Long Length of Stay Meetings, dates discussed:    Additional Comments:  [name], [name], RN 06/13/2015, 12:12 PM

## 2015-06-13 NOTE — Progress Notes (Signed)
[facility], case manager, myself met with interpreter at bedside to discuss wound care and medications, extra supplies sent home with pt.. Verbally understood DC instructions per interpreter.

## 2015-06-13 NOTE — Progress Notes (Signed)
1 Day Post-Op  Subjective: Pt doing well this AM.  No complaints.  Objective: Vital signs in last 24 hours: Temp:  [97.2 F (36.2 C)-98.7 F (37.1 C)] 98.3 F (36.8 C) (07/06 0603) Pulse Rate:  [56-84] 60 (07/06 0603) Resp:  [8-23] 16 (07/06 0603) BP: (105-131)/(63-82) 129/78 mmHg (07/06 0603) SpO2:  [98 %-100 %] 100 % (07/06 0603) Last BM Date: 06/10/15  Intake/Output from previous day: 07/05 0701 - 07/06 0700 In: 1745 [I.V.:1745] Out: 3200 [Urine:3200] Intake/Output this shift:    General appearance: alert and cooperative Incision/Wound: gluteal wound packed  Lab Results:   Recent Labs  06/11/15 1244 06/11/15 2252  WBC 12.7* 12.2*  HGB 15.3 14.2  HCT 44.2 41.8  PLT 351 370   BMET  Recent Labs  06/11/15 1244  NA 140  K 4.8  CL 104  CO2 26  GLUCOSE 103*  BUN 9  CREATININE 0.94  CALCIUM 9.3   PT/INR No results for input(s): LABPROT, INR in the last 72 hours. ABG No results for input(s): PHART, HCO3 in the last 72 hours.  Invalid input(s): PCO2, PO2  Studies/Results: Ct Pelvis W Contrast  06/11/2015   CLINICAL DATA:  Rectal abscess, rectal pain for 1 week, foul-smelling drainage from rectal region  EXAM: CT PELVIS WITH CONTRAST  TECHNIQUE: Multidetector CT imaging of the pelvis was performed using the standard protocol following the bolus administration of intravenous contrast. Sagittal and coronal MPR images reconstructed from axial data set.  CONTRAST:  100mL OMNIPAQUE IOHEXOL 300 MG/ML  SOLN IV  COMPARISON:  None.  FINDINGS: Bladder induced to ureters normal appearance.  Visualized bowel loops and portion of appendix normal appearance.  No acute intrapelvic abnormalities.  Infiltrative changes identified in the medial aspect of the RIGHT buttock compatible with an inflammatory process.  In addition, a focal gas and fluid collection with an enhancing margin is identified in the medial aspect of the RIGHT buttock compatible with abscess, overall measuring 5.0  x 2.0 x 6.2 cm in size.  No intrapelvic extension of this inflammatory process is identified.  No additional gas or fluid collections.  Small LEFT inguinal hernia.  Osseous structures unremarkable.  IMPRESSION: Soft tissue abscess identified at the medial aspect of RIGHT buttock 5.0 x 2.0 x 6.2 cm in size with surrounding inflammatory changes.  No intrapelvic extension of the inflammatory process is seen.  Small LEFT inguinal hernia.   Electronically Signed   By: Ulyses SouthwardMark  Boles M.D.   On: 06/11/2015 13:42    Anti-infectives: Anti-infectives    Start     Dose/Rate Route Frequency Ordered Stop   06/11/15 1730  Ampicillin-Sulbactam (UNASYN) 3 g in sodium chloride 0.9 % 100 mL IVPB     3 g 100 mL/hr over 60 Minutes Intravenous Every 6 hours 06/11/15 1701     06/11/15 1645  vancomycin (VANCOCIN) IVPB 1000 mg/200 mL premix     1,000 mg 200 mL/hr over 60 Minutes Intravenous  Once 06/11/15 1640 06/11/15 1754      Assessment/Plan: s/p Procedure(s): IRRIGATION AND DEBRIDEMENT BUTTOCK ABSCESS (Right) WIll have RN changed dressing with pt and educate  Possibly home later today.  LOS: 1 day    Joshua EhlersRamirez Jr., Joshua Patrick 06/13/2015

## 2015-06-16 LAB — CULTURE, ROUTINE-ABSCESS

## 2015-06-17 LAB — ANAEROBIC CULTURE

## 2015-06-26 ENCOUNTER — Ambulatory Visit: Payer: Self-pay | Attending: Family Medicine | Admitting: Family Medicine

## 2015-06-26 ENCOUNTER — Encounter: Payer: Self-pay | Admitting: Family Medicine

## 2015-06-26 VITALS — BP 126/84 | HR 80 | Temp 98.1°F | Resp 16 | Ht 67.0 in | Wt 181.0 lb

## 2015-06-26 DIAGNOSIS — L0291 Cutaneous abscess, unspecified: Secondary | ICD-10-CM

## 2015-06-26 DIAGNOSIS — IMO0001 Reserved for inherently not codable concepts without codable children: Secondary | ICD-10-CM | POA: Insufficient documentation

## 2015-06-26 DIAGNOSIS — I10 Essential (primary) hypertension: Secondary | ICD-10-CM | POA: Insufficient documentation

## 2015-06-26 DIAGNOSIS — L0231 Cutaneous abscess of buttock: Secondary | ICD-10-CM | POA: Insufficient documentation

## 2015-06-26 DIAGNOSIS — R030 Elevated blood-pressure reading, without diagnosis of hypertension: Secondary | ICD-10-CM

## 2015-06-26 NOTE — Assessment & Plan Note (Signed)
A: elevated BP, normalized on recheck P: Monitor BPs

## 2015-06-26 NOTE — Assessment & Plan Note (Signed)
A; gluteal abscess P: Healing Follow up with general surgery

## 2015-06-26 NOTE — Patient Instructions (Signed)
Mr. Joshua Patrick,  Thank you for coming in today  1. Gluteal abscess: Continue current care Keep follow up with general surgery Use tucks wipes for itching  2. Elevated BP: Goal BP is < 140/90 at all times  DASH diet  F/u in 3-4 weeks for BP check with RN  F/u with me in 2 months for physical   Dr. Armen PickupFunches

## 2015-06-26 NOTE — Progress Notes (Signed)
   Subjective:    Patient ID: Joshua Patrick, male    DOB: [birth year], 44 y.o.   MRN: [id number] CC: establish care, HFU gluteal abscess  HPI 44 yo M presents to establish care and discuss the following:  1. Gluteal abscess: s/p I &D on 06/12/2015. Doing well. Went to gen surg f/u today. No fever or chills. Some itching. Had similar problem 5 years ago.   2. Elevated BP: [place] Felt weak while taking the medicine and stopped. Non smoker. Mom with HTN.   Soc Hx: never smoker Med Hx: elevated BP in the past Surg Hx: gluteal I&D done  06/12/2015 Review of Systems  Constitutional: Negative for fever, chills, fatigue and unexpected weight change.  Eyes: Negative for visual disturbance.  Respiratory: Negative for cough and shortness of breath.   Cardiovascular: Negative for chest pain, palpitations and leg swelling.  Gastrointestinal: Negative for nausea, vomiting, abdominal pain, diarrhea, constipation and blood in stool.  Musculoskeletal: Negative for myalgias, back pain, arthralgias, gait problem and neck pain.  Skin: Positive for wound. Negative for rash.  Allergic/Immunologic: Negative for immunocompromised state.  Hematological: Negative for adenopathy. Does not bruise/bleed easily.  Psychiatric/Behavioral: Negative for dysphoric mood. The patient is not nervous/anxious.        Objective:   Physical Exam BP 126/84 mmHg  Pulse 80  Temp(Src) 98.1 F (36.7 C) (Oral)  Resp 16  Ht 5\' 7"  (1.702 m)  Wt 181 lb (82.101 kg)  BMI 28.34 kg/m2  SpO2 97%  BP Readings from Last 3 Encounters:  06/26/15 158/103  06/13/15 122/49  06/07/15 137/62  General appearance: alert, cooperative and no distress Lungs: clear to auscultation bilaterally Heart: regular rate and rhythm, S1, S2 normal, no murmur, click, rub or gallop Extremities: extremities normal, atraumatic, no cyanosis or edema      Assessment & Plan:

## 2015-06-26 NOTE — Progress Notes (Signed)
F/U cyst on bottom No pain Had a F/U appointment with surgeon today.

## 2016-04-07 ENCOUNTER — Emergency Department (HOSPITAL_COMMUNITY): Payer: Self-pay

## 2016-04-07 ENCOUNTER — Encounter (HOSPITAL_COMMUNITY): Payer: Self-pay | Admitting: Nurse Practitioner

## 2016-04-07 ENCOUNTER — Inpatient Hospital Stay (HOSPITAL_COMMUNITY)
Admission: EM | Admit: 2016-04-07 | Discharge: 2016-04-10 | DRG: 571 | Disposition: A | Discharge status: Home / Self Care | Payer: Self-pay | Attending: Surgery | Admitting: Surgery

## 2016-04-07 DIAGNOSIS — L0231 Cutaneous abscess of buttock: Principal | ICD-10-CM | POA: Diagnosis present

## 2016-04-07 DIAGNOSIS — B9689 Other specified bacterial agents as the cause of diseases classified elsewhere: Secondary | ICD-10-CM | POA: Diagnosis present

## 2016-04-07 DIAGNOSIS — L0390 Cellulitis, unspecified: Secondary | ICD-10-CM | POA: Diagnosis present

## 2016-04-07 LAB — CBC WITH DIFFERENTIAL/PLATELET
BASOS PCT: 0 %
Basophils Absolute: 0 10*3/uL (ref 0.0–0.1)
EOS ABS: 0 10*3/uL (ref 0.0–0.7)
EOS PCT: 0 %
HCT: 45.5 % (ref 39.0–52.0)
Hemoglobin: 15.5 g/dL (ref 13.0–17.0)
LYMPHS ABS: 1.6 10*3/uL (ref 0.7–4.0)
Lymphocytes Relative: 10 %
MCH: 30.5 pg (ref 26.0–34.0)
MCHC: 34.1 g/dL (ref 30.0–36.0)
MCV: 89.4 fL (ref 78.0–100.0)
Monocytes Absolute: 1.3 10*3/uL — ABNORMAL HIGH (ref 0.1–1.0)
Monocytes Relative: 8 %
Neutro Abs: 12.9 10*3/uL — ABNORMAL HIGH (ref 1.7–7.7)
Neutrophils Relative %: 81 %
PLATELETS: 355 10*3/uL (ref 150–400)
RBC: 5.09 MIL/uL (ref 4.22–5.81)
RDW: 12.2 % (ref 11.5–15.5)
WBC: 15.8 10*3/uL — AB (ref 4.0–10.5)

## 2016-04-07 LAB — COMPREHENSIVE METABOLIC PANEL
ALBUMIN: 4 g/dL (ref 3.5–5.0)
ALK PHOS: 74 U/L (ref 38–126)
ALT: 31 U/L (ref 17–63)
AST: 21 U/L (ref 15–41)
Anion gap: 12 (ref 5–15)
BILIRUBIN TOTAL: 1.2 mg/dL (ref 0.3–1.2)
BUN: 9 mg/dL (ref 6–20)
CALCIUM: 9.4 mg/dL (ref 8.9–10.3)
CHLORIDE: 103 mmol/L (ref 101–111)
CO2: 23 mmol/L (ref 22–32)
CREATININE: 0.86 mg/dL (ref 0.61–1.24)
GFR calc Af Amer: >60 mL/min (ref >60)
GFR calc non Af Amer: >60 mL/min (ref >60)
Glucose, Bld: 92 mg/dL (ref 65–99)
Potassium: 3.6 mmol/L (ref 3.5–5.1)
Sodium: 138 mmol/L (ref 135–145)
Total Protein: 7.7 g/dL (ref 6.5–8.1)

## 2016-04-07 LAB — I-STAT CG4 LACTIC ACID, ED: LACTIC ACID, VENOUS: 1.96 mmol/L (ref 0.5–2.0)

## 2016-04-07 MED ORDER — IOPAMIDOL (ISOVUE-300) INJECTION 61%
INTRAVENOUS | Status: AC
  Administered 2016-04-07: 100 mL
  Filled 2016-04-07: qty 100

## 2016-04-07 MED ORDER — KCL IN DEXTROSE-NACL 20-5-0.45 MEQ/L-%-% IV SOLN
INTRAVENOUS | Status: DC
  Administered 2016-04-08: via INTRAVENOUS
  Filled 2016-04-07: qty 1000

## 2016-04-07 MED ORDER — DIPHENHYDRAMINE HCL 12.5 MG/5ML PO ELIX: 12.5000 mg | ORAL_SOLUTION | Freq: Four times a day (QID) | ORAL | Status: DC | PRN

## 2016-04-07 MED ORDER — MORPHINE SULFATE (PF) 2 MG/ML IV SOLN
1.0000 mg | INTRAVENOUS | Status: DC | PRN
  Administered 2016-04-09: 2 mg via INTRAVENOUS
  Filled 2016-04-07: qty 1

## 2016-04-07 MED ORDER — SODIUM CHLORIDE 0.9 % IV BOLUS (SEPSIS)
30.0000 mL/kg | Freq: Once | INTRAVENOUS | Status: AC
  Administered 2016-04-07: 1000 mL via INTRAVENOUS

## 2016-04-07 MED ORDER — ACETAMINOPHEN 325 MG PO TABS
650.0000 mg | ORAL_TABLET | Freq: Four times a day (QID) | ORAL | Status: DC | PRN
  Administered 2016-04-07: 650 mg via ORAL
  Filled 2016-04-07: qty 2

## 2016-04-07 MED ORDER — ONDANSETRON HCL 4 MG/2ML IJ SOLN: 4.0000 mg | Freq: Once | INTRAMUSCULAR | Status: DC | PRN

## 2016-04-07 MED ORDER — ONDANSETRON HCL 4 MG/2ML IJ SOLN: 4.0000 mg | Freq: Four times a day (QID) | INTRAMUSCULAR | Status: DC | PRN

## 2016-04-07 MED ORDER — VANCOMYCIN HCL IN DEXTROSE 1-5 GM/200ML-% IV SOLN
1000.0000 mg | Freq: Three times a day (TID) | INTRAVENOUS | Status: DC
  Administered 2016-04-07 – 2016-04-09 (×6): 1000 mg via INTRAVENOUS
  Filled 2016-04-07 (×7): qty 200

## 2016-04-07 MED ORDER — PIPERACILLIN-TAZOBACTAM 3.375 G IVPB
3.3750 g | Freq: Three times a day (TID) | INTRAVENOUS | Status: DC
  Administered 2016-04-08 – 2016-04-10 (×8): 3.375 g via INTRAVENOUS
  Filled 2016-04-07 (×11): qty 50

## 2016-04-07 MED ORDER — OXYCODONE-ACETAMINOPHEN 5-325 MG PO TABS
2.0000 | ORAL_TABLET | Freq: Once | ORAL | Status: AC
  Administered 2016-04-07: 2 via ORAL
  Filled 2016-04-07: qty 2

## 2016-04-07 MED ORDER — MORPHINE SULFATE (PF) 4 MG/ML IV SOLN
4.0000 mg | Freq: Once | INTRAVENOUS | Status: AC | PRN
  Administered 2016-04-07: 4 mg via INTRAVENOUS
  Filled 2016-04-07: qty 1

## 2016-04-07 MED ORDER — ONDANSETRON 4 MG PO TBDP
4.0000 mg | ORAL_TABLET | Freq: Four times a day (QID) | ORAL | Status: DC | PRN
  Filled 2016-04-07: qty 1

## 2016-04-07 MED ORDER — DIPHENHYDRAMINE HCL 50 MG/ML IJ SOLN: 12.5000 mg | Freq: Four times a day (QID) | INTRAMUSCULAR | Status: DC | PRN

## 2016-04-07 NOTE — ED Notes (Signed)
Pt reports 1 week history painful hemorrhoid, he has been using OTC creams and taking advil with no relief. He denies any bleeding.

## 2016-04-07 NOTE — ED Provider Notes (Signed)
CSN: [id number]     Arrival date & time 04/07/16  1555 History  By signing my name below, I, Emmanuella Mensah, attest that this documentation has been prepared under the direction and in the presence of Xaiden Fleig, PA-C. Electronically Signed: Angelene Giovanni, ED Scribe. 04/07/2016. 4:45 PM.    Chief Complaint  Patient presents with  . Abscess   The history is provided by the patient. A language interpreter was used.   HPI Comments: Joshua Patrick is a 45 y.o. male with a hx of hemorrhoid surgery and irrigation/debridement of buttock abscess who presents to the Emergency Department complaining of gradually worsening multiple non-draining painful area of swelling in his gluteal cleft onset 4-5 days ago. Pt reports associated subjective fever and difficulty sitting secondary to pain. He explains that he visited his PCP 3 days ago and received medication to take approx. every 12 hours. He adds that he finished that medication this am. No n/v, constipation, or blood in stool. Pt has an upcoming appointment with a surgeon, Jose Hernandez-Reyes in [place], tomorrow morning.  Last oral intake was around 1 pm today.     Past Medical History  Diagnosis Date  . Hemorrhoids    Past Surgical History  Procedure Laterality Date  . Hemorrhoid surgery    . Irrigation and debridement buttocks Right 06/12/2015    Procedure: IRRIGATION AND DEBRIDEMENT BUTTOCK ABSCESS;  Surgeon: Axel Filler, MD;  Location: [facility] OR;  Service: General;  Laterality: Right;   Family History  Problem Relation Age of Onset  . Hypertension Mother    Social History  Substance Use Topics  . Smoking status: Never Smoker   . Smokeless tobacco: None  . Alcohol Use: No    Review of Systems  Constitutional: Positive for fever.  Gastrointestinal: Negative for nausea, vomiting, diarrhea, constipation and blood in stool.  Skin:       Painful area of swelling to gluteal cleft      Allergies  Review of patient's  allergies indicates no known allergies.  Home Medications   Prior to Admission medications   Medication Sig Start Date End Date Taking? Authorizing Provider  ibuprofen (ADVIL,MOTRIN) 200 MG tablet Take 400 mg by mouth every 6 (six) hours as needed for mild pain.   Yes Historical Provider, MD  OVER THE COUNTER MEDICATION Apply 1 application topically daily as needed (hemmroidal pain).   Yes Historical Provider, MD   BP 143/70 mmHg  Pulse 93  Temp(Src) 98.5 F (36.9 C) (Oral)  Resp 18  Ht 5\' 10"  (1.778 m)  Wt 87.998 kg  BMI 27.84 kg/m2  SpO2 99% Physical Exam  Constitutional: He is oriented to person, place, and time. He appears well-developed and well-nourished. No distress.  HENT:  Head: Normocephalic and atraumatic.  Eyes: Conjunctivae and EOM are normal. Pupils are equal, round, and reactive to light.  Neck: Neck supple.  Cardiovascular: Normal rate, regular rhythm, normal heart sounds and intact distal pulses.   Pulmonary/Chest: Effort normal and breath sounds normal. No respiratory distress.  Abdominal: Soft. There is no tenderness. There is no guarding.  Genitourinary:  Significant tenderness and erythema radiating from seemingly multiple areas of fluctuance on the right gluteal cleft extending towards the anus.  Musculoskeletal: Normal range of motion. He exhibits no edema or tenderness.  Lymphadenopathy:    He has no cervical adenopathy.  Neurological: He is alert and oriented to person, place, and time.  Skin: Skin is warm and dry. He is not diaphoretic.  Psychiatric: He  has a normal mood and affect. His behavior is normal.  Nursing note and vitals reviewed.   ED Course  Procedures (including critical care time) DIAGNOSTIC STUDIES: Oxygen Saturation is 98% on RA, normal by my interpretation.    COORDINATION OF CARE: 4:41 PM- Pt advised of plan for treatment and pt agrees.    Labs Review Labs Reviewed  CBC WITH DIFFERENTIAL/PLATELET - Abnormal; Notable for the  following:    WBC 15.8 (*)    Neutro Abs 12.9 (*)    Monocytes Absolute 1.3 (*)    All other components within normal limits  CULTURE, BLOOD (ROUTINE X 2)  CULTURE, BLOOD (ROUTINE X 2)  SURGICAL PCR SCREEN  ANAEROBIC CULTURE  GRAM STAIN  CULTURE, ROUTINE-ABSCESS  COMPREHENSIVE METABOLIC PANEL  BASIC METABOLIC PANEL  CBC  I-STAT CG4 LACTIC ACID, ED  I-STAT CG4 LACTIC ACID, ED  I-STAT CG4 LACTIC ACID, ED  CG4 I-STAT (LACTIC ACID)  CG4 I-STAT (LACTIC ACID)    Imaging Review Ct Abdomen Pelvis W Contrast  04/07/2016  CLINICAL DATA:  Gluteal abscess.  Concern for rectal involvement. EXAM: CT ABDOMEN AND PELVIS WITH CONTRAST TECHNIQUE: Multidetector CT imaging of the abdomen and pelvis was performed using the standard protocol following bolus administration of intravenous contrast. CONTRAST:  100mL ISOVUE-300 IOPAMIDOL (ISOVUE-300) INJECTION 61% COMPARISON:  06/11/2015 FINDINGS: Lower chest:  No significant abnormality Hepatobiliary: There are normal appearances of the liver, gallbladder and bile ducts. Pancreas: Normal Spleen: Normal Adrenals/Urinary Tract: The adrenals and kidneys are normal in appearance. There is no urinary calculus evident. There is no hydronephrosis or ureteral dilatation. Collecting systems and ureters appear unremarkable. Stomach/Bowel: There are normal appearances of the stomach, small bowel and colon. The appendix is normal. Vascular/Lymphatic: The abdominal aorta is normal in caliber. There is no atherosclerotic calcification. There is no adenopathy in the abdomen or pelvis. Reproductive: Unremarkable Other: There is a 6.1 x 2.6 by 4.3 cm right gluteal collection with surrounding inflammatory stranding, consistent with an abscess. There is no evidence of direct communication with the anus or rectum although the collection does extend to the posterior attachment of the right levator ani. No extension deep into the pelvis. Ischiorectal fossa appears uninvolved. No evidence of  extension to the coccygeal tip. Musculoskeletal: No significant skeletal lesion. IMPRESSION: Right gluteal collection with surrounding inflammation and skin thickening, consistent with an abscess. This is mildly larger than on 06/11/2015. No evidence of direct communication with the anus or rectum. No extension into the pelvis. Electronically Signed   By: Ellery Plunkaniel R Mitchell M.D.   On: 04/07/2016 19:56     Kwame Ryland, PA-C has personally reviewed and evaluated these images and lab results as part of her medical decision-making.   MDM   Final diagnoses:  Abscess, gluteal, right    Joshua Patrick presents with swelling, tenderness, and erythema to the right gluteal region for the last 4-5 days.   Findings and plan of care discussed with Joshua BaleElliott Wentz, MD.  Patient had a similar workup last year when he had to be taken to the OR for surgical I&D. Patient has leukocytosis, his lactic acid is barely below the 2.0 mark, and he has a known area of infection, qualifying him for a sepsis workup.  8:57 PM Spoke with Dr. Julian ReilGardner, hospitalist. Requested that we contact surgery for this issue prior to any admission.   9:09 PM Spoke with Dr. Andrey CampanileWilson, General Surgery, who agreed to come see the patient when he gets a chance. Dr. Andrey CampanileWilson decided to admit  the patient for surgical I&D scheduled tomorrow.  I personally performed the services described in this documentation, which was scribed in my presence. The recorded information has been reviewed and is accurate.  Filed Vitals:   04/07/16 1617 04/07/16 1733 04/07/16 1900 04/07/16 1911  BP: 144/86   122/64  Pulse: 95   82  Temp: 98.7 F (37.1 C) 100.3 F (37.9 C)  98.8 F (37.1 C)  TempSrc: Oral Oral  Oral  Resp: 17   22  Height:    (1.778 m)   Weight:   87.998 kg   SpO2: 98%   98%   Filed Vitals:   04/07/16 2315 04/07/16 2330 04/07/16 2332 04/08/16 0006  BP: 144/79   143/70  Pulse: 100 99  93  Temp:   99.4 F (37.4 C) 98.5 F (36.9  C)  TempSrc:   Oral Oral  Resp:  16  18  Height:      Weight:      SpO2: 97% 97%  99%     Anselm Pancoast, PA-C 04/08/16 0248  Joshua Bale, MD 04/08/16 1545

## 2016-04-07 NOTE — ED Notes (Signed)
Patient placed in gown for examination

## 2016-04-07 NOTE — Progress Notes (Signed)
Pharmacy Antibiotic Note  Joshua Patrick is a 45 y.o. male admitted on 04/07/2016 with cellulitis and potential rectal abcess.  Pharmacy has been consulted for vancomycin dosing.  Plan: Vancomycin 1 IV every 8 hours.  Goal trough 15-20 mcg/mL.  Height: 5\' 10"  (177.8 cm) Weight: 194 lb (87.998 kg) IBW/kg (Calculated) : 73  Temp (24hrs), Avg:99.5 F (37.5 C), Min:98.7 F (37.1 C), Max:100.3 F (37.9 C)   Recent Labs Lab 04/07/16 1720 04/07/16 1756  WBC 15.8*  --   CREATININE 0.86  --   LATICACIDVEN  --  1.96    Estimated Creatinine Clearance: 122.5 mL/min (by C-G formula based on Cr of 0.86).    No Known Allergies  Antimicrobials this admission: Vancomycin 5/1>>  Dose adjustments this admission: None  Microbiology results: Blood x 2 5/1>>  Isaac BlissMichael Tanaya Dunigan, PharmD, BCPS, [facility] Clinical Pharmacist Pager [phone] 04/07/2016 7:08 PM

## 2016-04-07 NOTE — H&P (Signed)
Joshua Patrick is an 45 y.o. male.  Conducted with telephone interpreter.  Chief Complaint: buttock pain HPI: 45yo HM with h/o right buttock abscess July 2016 that required I&D in OR comes back in with 5 days of right buttock pain. Saw a MD in [place] on 4/27 and was given rx for Augmentin which he has been taking with no improvement. Pain worsening. +f/c. No n/v/diarrhea/constipation/dysuria/trouble voiding.   Denies smoking, etoh, drugs.   Past Medical History  Diagnosis Date  . Hemorrhoids     Past Surgical History  Procedure Laterality Date  . Hemorrhoid surgery    . Irrigation and debridement buttocks Right 06/12/2015    Procedure: IRRIGATION AND DEBRIDEMENT BUTTOCK ABSCESS;  Surgeon: Ralene Ok, MD;  Location: [place];  Service: General;  Laterality: Right;    Family History  Problem Relation Age of Onset  . Hypertension Mother    Social History:  reports that he has never smoked. He does not have any smokeless tobacco history on file. He reports that he does not drink alcohol or use illicit drugs.  Allergies: No Known Allergies   (Not in a hospital admission)  Results for orders placed or performed during the hospital encounter of 04/07/16 (from the past 48 hour(s))  Comprehensive metabolic panel     Status: None   Collection Time: 04/07/16  5:20 PM  Result Value Ref Range   Sodium 138 135 - 145 mmol/L   Potassium 3.6 3.5 - 5.1 mmol/L   Chloride 103 101 - 111 mmol/L   CO2 23 22 - 32 mmol/L   Glucose, Bld 92 65 - 99 mg/dL   BUN 9 6 - 20 mg/dL   Creatinine, Ser 0.86 0.61 - 1.24 mg/dL   Calcium 9.4 8.9 - 10.3 mg/dL   Total Protein 7.7 6.5 - 8.1 g/dL   Albumin 4.0 3.5 - 5.0 g/dL   AST 21 15 - 41 U/L   ALT 31 17 - 63 U/L   Alkaline Phosphatase 74 38 - 126 U/L   Total Bilirubin 1.2 0.3 - 1.2 mg/dL   GFR calc non Af Amer >60 >60 mL/min   GFR calc Af Amer >60 >60 mL/min    Comment: (NOTE) The eGFR has been calculated using the CKD EPI equation. This calculation  has not been validated in all clinical situations. eGFR's persistently <60 mL/min signify possible Chronic Kidney Disease.    Anion gap 12 5 - 15  CBC with Differential     Status: Abnormal   Collection Time: 04/07/16  5:20 PM  Result Value Ref Range   WBC 15.8 (H) 4.0 - 10.5 K/uL   RBC 5.09 4.22 - 5.81 MIL/uL   Hemoglobin 15.5 13.0 - 17.0 g/dL   HCT 45.5 39.0 - 52.0 %   MCV 89.4 78.0 - 100.0 fL   MCH 30.5 26.0 - 34.0 pg   MCHC 34.1 30.0 - 36.0 g/dL   RDW 12.2 11.5 - 15.5 %   Platelets 355 150 - 400 K/uL   Neutrophils Relative % 81 %   Neutro Abs 12.9 (H) 1.7 - 7.7 K/uL   Lymphocytes Relative 10 %   Lymphs Abs 1.6 0.7 - 4.0 K/uL   Monocytes Relative 8 %   Monocytes Absolute 1.3 (H) 0.1 - 1.0 K/uL   Eosinophils Relative 0 %   Eosinophils Absolute 0.0 0.0 - 0.7 K/uL   Basophils Relative 0 %   Basophils Absolute 0.0 0.0 - 0.1 K/uL  I-Stat CG4 Lactic Acid, ED  Status: None   Collection Time: 04/07/16  5:56 PM  Result Value Ref Range   Lactic Acid, Venous 1.96 0.5 - 2.0 mmol/L   Ct Abdomen Pelvis W Contrast  04/07/2016  CLINICAL DATA:  Gluteal abscess.  Concern for rectal involvement. EXAM: CT ABDOMEN AND PELVIS WITH CONTRAST TECHNIQUE: Multidetector CT imaging of the abdomen and pelvis was performed using the standard protocol following bolus administration of intravenous contrast. CONTRAST:  131m ISOVUE-300 IOPAMIDOL (ISOVUE-300) INJECTION 61% COMPARISON:  06/11/2015 FINDINGS: Lower chest:  No significant abnormality Hepatobiliary: There are normal appearances of the liver, gallbladder and bile ducts. Pancreas: Normal Spleen: Normal Adrenals/Urinary Tract: The adrenals and kidneys are normal in appearance. There is no urinary calculus evident. There is no hydronephrosis or ureteral dilatation. Collecting systems and ureters appear unremarkable. Stomach/Bowel: There are normal appearances of the stomach, small bowel and colon. The appendix is normal. Vascular/Lymphatic: The abdominal  aorta is normal in caliber. There is no atherosclerotic calcification. There is no adenopathy in the abdomen or pelvis. Reproductive: Unremarkable Other: There is a 6.1 x 2.6 by 4.3 cm right gluteal collection with surrounding inflammatory stranding, consistent with an abscess. There is no evidence of direct communication with the anus or rectum although the collection does extend to the posterior attachment of the right levator ani. No extension deep into the pelvis. Ischiorectal fossa appears uninvolved. No evidence of extension to the coccygeal tip. Musculoskeletal: No significant skeletal lesion. IMPRESSION: Right gluteal collection with surrounding inflammation and skin thickening, consistent with an abscess. This is mildly larger than on 06/11/2015. No evidence of direct communication with the anus or rectum. No extension into the pelvis. Electronically Signed   By: DAndreas NewportM.D.   On: 04/07/2016 19:56    Review of Systems  Constitutional: Positive for fever and chills. Negative for weight loss.  HENT: Negative for nosebleeds.   Eyes: Negative for blurred vision.  Respiratory: Negative for shortness of breath.   Cardiovascular: Negative for chest pain, palpitations, orthopnea and PND.       Denies DOE  Gastrointestinal: Negative for nausea, vomiting and abdominal pain.  Genitourinary: Negative for dysuria and hematuria.  Musculoskeletal: Negative.   Skin: Negative for itching and rash.  Neurological: Negative for dizziness, focal weakness, seizures, loss of consciousness and headaches.       Denies TIAs, amaurosis fugax  Endo/Heme/Allergies: Does not bruise/bleed easily.  Psychiatric/Behavioral: The patient is not nervous/anxious.     Blood pressure 136/80, pulse 80, temperature 98 F (36.7 C), temperature source Oral, resp. rate 17, height _0  (1.778 m), weight 87.998 kg (194 lb), SpO2 98 %. Physical Exam  Vitals reviewed. Constitutional: He is oriented to person, place,  and time. He appears well-developed and well-nourished. He appears ill. No distress.  HENT:  Head: Normocephalic and atraumatic.  Right Ear: External ear normal.  Left Ear: External ear normal.  Eyes: Conjunctivae are normal. No scleral icterus.  Neck: Normal range of motion. Neck supple. No tracheal deviation present. No thyromegaly present.  Cardiovascular: Normal rate and normal heart sounds.   Respiratory: Effort normal and breath sounds normal. No stridor. No respiratory distress. He has no wheezes.  GI: Soft. He exhibits no distension. There is no tenderness. There is no rebound.  Musculoskeletal: He exhibits no edema or tenderness.  Lymphadenopathy:    He has no cervical adenopathy.  Neurological: He is alert and oriented to person, place, and time. He exhibits normal muscle tone.  Skin: Skin is warm and dry. No rash  noted. He is not diaphoretic. No erythema. No pallor.  Psychiatric: He has a normal mood and affect. His behavior is normal. Judgment and thought content normal.     Assessment/Plan Recurrent right gluteal abscess  To complex to drain in ED. Will require OR I&D. Discussed risk/benefits including but not limited to bleeding, infection, need for addl procedures, scarring, blood clot, anesthesia events, need for daily wound care. Discussed typical hospital course.   Due to large number of OR cases going right now and tonight, will plan sx Tuesday NPO p mn IV abx Pain control  Leighton Ruff. Redmond Pulling, MD, FACS General, Bariatric, & Minimally Invasive Surgery [facility], [place]   Gayland Curry, MD 04/07/2016, 10:03 PM

## 2016-04-07 NOTE — ED Notes (Signed)
PT resting quietly at this time.  Family at bedside

## 2016-04-07 NOTE — ED Notes (Addendum)
Dr. Andrey CampanileWilson at bedside   Translator line called.

## 2016-04-07 NOTE — ED Notes (Signed)
Consent obtained from pt

## 2016-04-08 ENCOUNTER — Encounter (HOSPITAL_COMMUNITY): Admission: EM | Disposition: A | Discharge status: Home / Self Care | Payer: Self-pay | Source: Home / Self Care

## 2016-04-08 ENCOUNTER — Inpatient Hospital Stay (HOSPITAL_COMMUNITY): Payer: Self-pay | Admitting: Certified Registered"

## 2016-04-08 ENCOUNTER — Encounter (HOSPITAL_COMMUNITY): Payer: Self-pay | Admitting: Certified Registered"

## 2016-04-08 HISTORY — PX: INCISION AND DRAINAGE PERIRECTAL ABSCESS: SHX1804

## 2016-04-08 LAB — CG4 I-STAT (LACTIC ACID)
LACTIC ACID, VENOUS: 1.06 mmol/L (ref 0.5–2.0)
LACTIC ACID, VENOUS: 1.24 mmol/L (ref 0.5–2.0)

## 2016-04-08 LAB — CBC
HEMATOCRIT: 38.2 % — AB (ref 39.0–52.0)
HEMOGLOBIN: 12.7 g/dL — AB (ref 13.0–17.0)
MCH: 30.5 pg (ref 26.0–34.0)
MCHC: 33.2 g/dL (ref 30.0–36.0)
MCV: 91.6 fL (ref 78.0–100.0)
Platelets: 311 10*3/uL (ref 150–400)
RBC: 4.17 MIL/uL — AB (ref 4.22–5.81)
RDW: 12.2 % (ref 11.5–15.5)
WBC: 17 10*3/uL — ABNORMAL HIGH (ref 4.0–10.5)

## 2016-04-08 LAB — BASIC METABOLIC PANEL
ANION GAP: 9 (ref 5–15)
BUN: 8 mg/dL (ref 6–20)
CHLORIDE: 104 mmol/L (ref 101–111)
CO2: 23 mmol/L (ref 22–32)
CREATININE: 0.83 mg/dL (ref 0.61–1.24)
Calcium: 8.4 mg/dL — ABNORMAL LOW (ref 8.9–10.3)
GFR calc non Af Amer: >60 mL/min (ref >60)
Glucose, Bld: 120 mg/dL — ABNORMAL HIGH (ref 65–99)
POTASSIUM: 3.4 mmol/L — AB (ref 3.5–5.1)
Sodium: 136 mmol/L (ref 135–145)

## 2016-04-08 LAB — SURGICAL PCR SCREEN
MRSA, PCR: NEGATIVE
Staphylococcus aureus: NEGATIVE

## 2016-04-08 SURGERY — INCISION AND DRAINAGE, ABSCESS, PERIRECTAL
Anesthesia: General | Site: Buttocks | Laterality: Right

## 2016-04-08 MED ORDER — KCL IN DEXTROSE-NACL 20-5-0.45 MEQ/L-%-% IV SOLN
INTRAVENOUS | Status: DC
Start: 2016-04-08 — End: 2016-04-09
  Administered 2016-04-08: 04:00:00 via INTRAVENOUS

## 2016-04-08 MED ORDER — PROPOFOL 10 MG/ML IV BOLUS
INTRAVENOUS | Status: AC
  Filled 2016-04-08: qty 20

## 2016-04-08 MED ORDER — OXYCODONE HCL 5 MG PO TABS: 5.0000 mg | ORAL_TABLET | Freq: Once | ORAL | Status: DC | PRN

## 2016-04-08 MED ORDER — POLYETHYLENE GLYCOL 3350 17 G PO PACK
17.0000 g | PACK | Freq: Every day | ORAL | Status: DC
  Administered 2016-04-08 – 2016-04-10 (×3): 17 g via ORAL
  Filled 2016-04-08 (×2): qty 1

## 2016-04-08 MED ORDER — PROPOFOL 10 MG/ML IV BOLUS
INTRAVENOUS | Status: DC | PRN
  Administered 2016-04-08: 150 mg via INTRAVENOUS

## 2016-04-08 MED ORDER — OXYCODONE HCL 5 MG/5ML PO SOLN: 5.0000 mg | Freq: Once | ORAL | Status: DC | PRN

## 2016-04-08 MED ORDER — ONDANSETRON HCL 4 MG/2ML IJ SOLN
INTRAMUSCULAR | Status: DC | PRN
  Administered 2016-04-08: 4 mg via INTRAVENOUS

## 2016-04-08 MED ORDER — OXYCODONE HCL 5 MG PO TABS
5.0000 mg | ORAL_TABLET | ORAL | Status: DC | PRN
  Administered 2016-04-09 – 2016-04-10 (×5): 10 mg via ORAL
  Filled 2016-04-08 (×5): qty 2

## 2016-04-08 MED ORDER — DOCUSATE SODIUM 100 MG PO CAPS
100.0000 mg | ORAL_CAPSULE | Freq: Two times a day (BID) | ORAL | Status: DC
  Administered 2016-04-08 – 2016-04-10 (×5): 100 mg via ORAL
  Filled 2016-04-08 (×4): qty 1

## 2016-04-08 MED ORDER — 0.9 % SODIUM CHLORIDE (POUR BTL) OPTIME
TOPICAL | Status: DC | PRN
  Administered 2016-04-08: 200 mL

## 2016-04-08 MED ORDER — LACTATED RINGERS IV SOLN
INTRAVENOUS | Status: DC | PRN
Start: 2016-04-08 — End: 2016-04-08
  Administered 2016-04-08 (×2): via INTRAVENOUS

## 2016-04-08 MED ORDER — MIDAZOLAM HCL 2 MG/2ML IJ SOLN
INTRAMUSCULAR | Status: AC
Start: 2016-04-08 — End: 2016-04-08
  Filled 2016-04-08: qty 2

## 2016-04-08 MED ORDER — HEPARIN SODIUM (PORCINE) 5000 UNIT/ML IJ SOLN
5000.0000 [IU] | Freq: Three times a day (TID) | INTRAMUSCULAR | Status: DC
  Administered 2016-04-08 – 2016-04-10 (×6): 5000 [IU] via SUBCUTANEOUS
  Filled 2016-04-08 (×6): qty 1

## 2016-04-08 MED ORDER — FENTANYL CITRATE (PF) 250 MCG/5ML IJ SOLN
INTRAMUSCULAR | Status: AC
  Filled 2016-04-08: qty 5

## 2016-04-08 MED ORDER — MIDAZOLAM HCL 5 MG/5ML IJ SOLN
INTRAMUSCULAR | Status: DC | PRN
  Administered 2016-04-08: 2 mg via INTRAVENOUS

## 2016-04-08 MED ORDER — ONDANSETRON HCL 4 MG/2ML IJ SOLN
4.0000 mg | Freq: Once | INTRAMUSCULAR | Status: DC | PRN
Start: 2016-04-08 — End: 2016-04-08

## 2016-04-08 MED ORDER — HYDROMORPHONE HCL 1 MG/ML IJ SOLN: 0.2500 mg | INTRAMUSCULAR | Status: DC | PRN

## 2016-04-08 MED ORDER — IBUPROFEN 200 MG PO TABS
600.0000 mg | ORAL_TABLET | Freq: Four times a day (QID) | ORAL | Status: DC | PRN
  Administered 2016-04-08 – 2016-04-09 (×2): 600 mg via ORAL
  Filled 2016-04-08 (×2): qty 3

## 2016-04-08 MED ORDER — SUCCINYLCHOLINE CHLORIDE 20 MG/ML IJ SOLN
INTRAMUSCULAR | Status: DC | PRN
  Administered 2016-04-08: 160 mg via INTRAVENOUS

## 2016-04-08 MED ORDER — FENTANYL CITRATE (PF) 100 MCG/2ML IJ SOLN
INTRAMUSCULAR | Status: DC | PRN
  Administered 2016-04-08: 50 ug via INTRAVENOUS
  Administered 2016-04-08 (×2): 100 ug via INTRAVENOUS

## 2016-04-08 MED ORDER — LIDOCAINE HCL (CARDIAC) 20 MG/ML IV SOLN
INTRAVENOUS | Status: DC | PRN
  Administered 2016-04-08: 40 mg via INTRAVENOUS

## 2016-04-08 SURGICAL SUPPLY — 34 items
CANISTER SUCTION 2500CC (MISCELLANEOUS) ×3 IMPLANT
COVER SURGICAL LIGHT HANDLE (MISCELLANEOUS) ×3 IMPLANT
DRAPE LAPAROTOMY 100X72 PEDS (DRAPES) ×3 IMPLANT
DRSG PAD ABDOMINAL 8X10 ST (GAUZE/BANDAGES/DRESSINGS) ×3 IMPLANT
ELECT CAUTERY BLADE 6.4 (BLADE) ×3 IMPLANT
ELECT REM PT RETURN 9FT ADLT (ELECTROSURGICAL) ×3
ELECTRODE REM PT RTRN 9FT ADLT (ELECTROSURGICAL) ×1 IMPLANT
GAUZE IODOFORM PACK 1/2 7832 (GAUZE/BANDAGES/DRESSINGS) ×3 IMPLANT
GAUZE SPONGE 4X4 12PLY STRL (GAUZE/BANDAGES/DRESSINGS) ×3 IMPLANT
GLOVE BIOGEL M STRL SZ7.5 (GLOVE) ×6 IMPLANT
GLOVE BIOGEL PI IND STRL 8 (GLOVE) ×3 IMPLANT
GLOVE BIOGEL PI INDICATOR 8 (GLOVE) ×6
GOWN STRL REUS W/ TWL LRG LVL3 (GOWN DISPOSABLE) ×1 IMPLANT
GOWN STRL REUS W/ TWL XL LVL3 (GOWN DISPOSABLE) ×1 IMPLANT
GOWN STRL REUS W/TWL LRG LVL3 (GOWN DISPOSABLE) ×2
GOWN STRL REUS W/TWL XL LVL3 (GOWN DISPOSABLE) ×2
KIT BASIN OR (CUSTOM PROCEDURE TRAY) ×3 IMPLANT
KIT ROOM TURNOVER OR (KITS) ×3 IMPLANT
NEEDLE 18GX1X1/2 (RX/OR ONLY) (NEEDLE) ×3 IMPLANT
NS IRRIG 1000ML POUR BTL (IV SOLUTION) ×3 IMPLANT
PACK LITHOTOMY IV (CUSTOM PROCEDURE TRAY) ×3 IMPLANT
PAD ARMBOARD 7.5X6 YLW CONV (MISCELLANEOUS) ×3 IMPLANT
PENCIL BUTTON HOLSTER BLD 10FT (ELECTRODE) ×3 IMPLANT
SPONGE LAP 18X18 X RAY DECT (DISPOSABLE) ×6 IMPLANT
SWAB COLLECTION DEVICE MRSA (MISCELLANEOUS) ×3 IMPLANT
SYR BULB IRRIGATION 50ML (SYRINGE) ×3 IMPLANT
SYRINGE 20CC LL (MISCELLANEOUS) ×3 IMPLANT
TOWEL OR 17X24 6PK STRL BLUE (TOWEL DISPOSABLE) ×3 IMPLANT
TOWEL OR 17X26 10 PK STRL BLUE (TOWEL DISPOSABLE) ×3 IMPLANT
TUBE ANAEROBIC SPECIMEN COL (MISCELLANEOUS) IMPLANT
TUBE CONNECTING 12'X1/4 (SUCTIONS) ×1
TUBE CONNECTING 12X1/4 (SUCTIONS) ×2 IMPLANT
UNDERPAD 30X30 INCONTINENT (UNDERPADS AND DIAPERS) ×3 IMPLANT
YANKAUER SUCT BULB TIP NO VENT (SUCTIONS) ×3 IMPLANT

## 2016-04-08 NOTE — Op Note (Signed)
04/07/2016 - 04/08/2016  2:25 AM  PATIENT:  Joshua Patrick  45 y.o. male  PRE-OPERATIVE DIAGNOSIS:  Right gluteal abcess  POST-OPERATIVE DIAGNOSIS:  same  PROCEDURE:  Procedure(s): INCISION, DRAINAGE AND DEBRIDEMENT RIGHT BUTTOCK ABSCESS (with scalpel)  SURGEON:  Surgeon(s): Gaynelle AduEric Jahnia Hewes, MD  ASSISTANTS: none   ANESTHESIA:   general  DRAINS: none   LOCAL MEDICATIONS USED:  NONE  SPECIMEN:  Source of Specimen:  abscess; aerobic/anaerobic  DISPOSITION OF SPECIMEN:  micro  COUNTS:  YES  INDICATION FOR PROCEDURE: 45 year old Hispanic male with a history of a right gluteal-buttock abscess that required incision and drainage last summer presents back to the emergency room complaining of 5 days of buttock pain. He states it is on the same side but maybe a little bit higher. He reports fever and chills. He saw a physician a few days ago and was placed on Augmentin however it did not cause any improvement in his situation. A CT scan in the emergency room demonstrated a 6 x 6 cm right gluteal abscess. There did not appear to be any connection to the rectum. I discussed at length the risk and benefits of the procedure in detail with the phone interpreter and the patient. Please see my note for further details  PROCEDURE: After obtaining informed consent the [facility] and general endotracheal anesthesia was established. Sequential compression devices were placed. The patient was then placed in the prone position on the operating room table. His buttocks were taped apart. His buttocks and perineum were prepped with Betadine and draped in the usual standard surgical fashion. A surgical timeout was performed. He had been started on broad-spectrum IV antibiotics in the emergency room. He had a large area of fluctuance in the upper right gluteal fold-inner buttock area. This appeared to be an area of scar from his prior incision and drainage. The  area of induration and cellulitis was approximately 13 cm vertical by about 9 cm wide. Using a scalpel I elliptically excised the area of fluctuance skin. This resulted in a large amount of seropurulent drainage. A right aerobic and anaerobic cultures were obtained. The cavity tracked inferiorly underneath the skin edge for about 3 cm. It also tracks superior medially for about 3 cm. I did not feel I need to make a counterincision since I could adequately packed cavity. The entire cavity was somewhat oozy. Hemostasis was achieved with electrocautery. Half-inch iodoform gauze was used to pack the cavity followed by dry gauze fluffs and mesh underwear. Patient tolerated the procedure well. All needle, instrument and sponge counts were correct 2. There are no immediate complications. The patient was placed in the supine position and extubated and taken to the recovery room in stable condition. There were no immediate complications.  Tool used for debridement (curette, scapel, etc.)  scalpel  Frequency of surgical debridement.   Initial debridement  Area and depth of devitalized tissue removed from wound.  2 x 3 cm  Blood loss and description of tissue removed.  Less than 25 mL; skin and soft tissue  Was there any viable tissue removed (measurements): No  PLAN OF CARE: pacu then back to floor  PATIENT DISPOSITION:  PACU - hemodynamically stable.   Delay start of Pharmacological VTE agent (>24hrs) due to surgical blood loss or risk of bleeding:  no  Mary SellaEric M. Andrey CampanileWilson, MD, FACS General, Bariatric, & Minimally Invasive Surgery [facility], [place]

## 2016-04-08 NOTE — Anesthesia Procedure Notes (Signed)
Procedure Name: Intubation Date/Time: 04/08/2016 1:40 AM Performed by: Arlice ColtMANESS, Enis Leatherwood B Pre-anesthesia Checklist: Patient identified, Emergency Drugs available, Suction available, Patient being monitored and Timeout performed Patient Re-evaluated:Patient Re-evaluated prior to inductionOxygen Delivery Method: Circle system utilized Preoxygenation: Pre-oxygenation with 100% oxygen Intubation Type: IV induction and Rapid sequence Laryngoscope Size: Mac and 3 Grade View: Grade I Tube type: Oral Tube size: 7.5 mm Number of attempts: 1 Airway Equipment and Method: Stylet Placement Confirmation: ETT inserted through vocal cords under direct vision,  positive ETCO2 and breath sounds checked- equal and bilateral Secured at: 22 cm Tube secured with: Tape Dental Injury: Teeth and Oropharynx as per pre-operative assessment

## 2016-04-08 NOTE — Anesthesia Postprocedure Evaluation (Signed)
Anesthesia Post Note  Patient: [name]  Procedure(s) Performed: Procedure(s) (LRB): IRRIGATION AND DEBRIDEMENT  RIGHT GLUTEAL ABSCESS WITH CULTURES (Right)  Patient location during evaluation: PACU Anesthesia Type: General Level of consciousness: awake and alert, oriented and awake Pain management: pain level controlled Vital Signs Assessment: post-procedure vital signs reviewed and stable Respiratory status: spontaneous breathing, nonlabored ventilation and respiratory function stable Cardiovascular status: blood pressure returned to baseline Anesthetic complications: no    Last Vitals:  Filed Vitals:   04/08/16 0306 04/08/16 0310  BP: 110/59 118/55  Pulse: 92 86  Temp:  36.5 C  Resp: 12 13    Last Pain:  Filed Vitals:   04/08/16 0314  PainSc: 5                  Dawt Reeb COKER

## 2016-04-08 NOTE — Progress Notes (Signed)
Day of Surgery  Subjective: He is awake and just had clears for breakfast.  No complaints, dressing is wet, so we will change outer dressing.    Objective: Vital signs in last 24 hours: Temp:  [97.6 F (36.4 C)-103.2 F (39.6 C)] 98.9 F (37.2 C) (05/02 0500) Pulse Rate:  [76-100] 76 (05/02 0500) Resp:  [12-39] 16 (05/02 0500) BP: (102-146)/(55-118) 109/57 mmHg (05/02 0500) SpO2:  [91 %-100 %] 98 % (05/02 0500) Weight:  [87.998 kg (194 lb)] 87.998 kg (194 lb) (05/01 1900) Last BM Date: 04/07/16 PO 50 Urine 540 TM 103.2  -  2200 hours last PM, no fever since then VSS K+ 3.4, WBE up to 17K CT 04/07/16 Intake/Output from previous day: 05/01 0701 - 05/02 0700 In: 3385.8 [P.O.:50; I.V.:2835.8; IV Piggyback:500] Out: 550 [Urine:540; Blood:10] Intake/Output this shift:    General appearance: alert, cooperative and no distress Skin: Skin color, texture, turgor normal. No rashes or lesions or some drainage on ABD, from open/packed site of I&D.  He is tolerting it well.  Moved easily in bed.  Lab Results:   Recent Labs  04/07/16 1720 04/08/16 0503  WBC 15.8* 17.0*  HGB 15.5 12.7*  HCT 45.5 38.2*  PLT 355 311    BMET  Recent Labs  04/07/16 1720 04/08/16 0503  NA 138 136  K 3.6 3.4*  CL 103 104  CO2 23 23  GLUCOSE 92 120*  BUN 9 8  CREATININE 0.86 0.83  CALCIUM 9.4 8.4*   PT/INR No results for input(s): LABPROT, INR in the last 72 hours.   Recent Labs Lab 04/07/16 1720  AST 21  ALT 31  ALKPHOS 74  BILITOT 1.2  PROT 7.7  ALBUMIN 4.0     Lipase  No results found for: LIPASE   Studies/Results: Ct Abdomen Pelvis W Contrast  04/07/2016  CLINICAL DATA:  Gluteal abscess.  Concern for rectal involvement. EXAM: CT ABDOMEN AND PELVIS WITH CONTRAST TECHNIQUE: Multidetector CT imaging of the abdomen and pelvis was performed using the standard protocol following bolus administration of intravenous contrast. CONTRAST:  ISOVUE-300 IOPAMIDOL (ISOVUE-300)  INJECTION 61% COMPARISON:  06/11/2015 FINDINGS: Lower chest:  No significant abnormality Hepatobiliary: There are normal appearances of the liver, gallbladder and bile ducts. Pancreas: Normal Spleen: Normal Adrenals/Urinary Tract: The adrenals and kidneys are normal in appearance. There is no urinary calculus evident. There is no hydronephrosis or ureteral dilatation. Collecting systems and ureters appear unremarkable. Stomach/Bowel: There are normal appearances of the stomach, small bowel and colon. The appendix is normal. Vascular/Lymphatic: The abdominal aorta is normal in caliber. There is no atherosclerotic calcification. There is no adenopathy in the abdomen or pelvis. Reproductive: Unremarkable Other: There is a 6.1 x 2.6 by 4.3 cm right gluteal collection with surrounding inflammatory stranding, consistent with an abscess. There is no evidence of direct communication with the anus or rectum although the collection does extend to the posterior attachment of the right levator ani. No extension deep into the pelvis. Ischiorectal fossa appears uninvolved. No evidence of extension to the coccygeal tip. Musculoskeletal: No significant skeletal lesion. IMPRESSION: Right gluteal collection with surrounding inflammation and skin thickening, consistent with an abscess. This is mildly larger than on 06/11/2015. No evidence of direct communication with the anus or rectum. No extension into the pelvis. Electronically Signed   By: Ellery Plunk M.D.   On: 04/07/2016 19:56    Medications: . docusate sodium  100 mg Oral BID  . heparin subcutaneous  5,000 Units Subcutaneous Q8H  .  piperacillin-tazobactam (ZOSYN)  IV  3.375 g Intravenous Q8H  . polyethylene glycol  17 g Oral Daily  . vancomycin  1,000 mg Intravenous Q8H   . dextrose 5 % and 0.45 % NaCl with KCl 20 mEq/L 50 mL/hr at 04/08/16 0330   Prior to Admission medications   Medication Sig Start Date End Date Taking? Authorizing Provider  ibuprofen  (ADVIL,MOTRIN) 200 MG tablet Take 400 mg by mouth every 6 (six) hours as needed for mild pain.   Yes Historical Provider, MD  OVER THE COUNTER MEDICATION Apply 1 application topically daily as needed (hemmroidal pain).   Yes Historical Provider, MD    Assessment/Plan Right gluteal abscess S/p I&D right buttocks abscess with scalpel, 04/08/16,DR. Wilson  Hx of I&D right buttocks abscess/I&D 06/12/15/[facility] - multiple organisms last year on culture FEN:  IV fluids/clear diet ID:Day 2 vancomycin and Zosyn VTE:  Heparin/SCD     Plan:  I will have them change the ABD pad PRN, we will continue IV antibiotics today, and take down dressing and packing tomorrow, unless he soils it with BM today.   I have ordered supplies to the room.       LOS: 1 day    Alaa Mullally 04/08/2016 [phone]

## 2016-04-08 NOTE — Anesthesia Preprocedure Evaluation (Signed)
Anesthesia Evaluation  Patient identified by MRN, date of birth, ID band Patient awake    Reviewed: Allergy & Precautions, NPO status , Patient's Chart, lab work & pertinent test results  Airway Mallampati: II  TM Distance: >3 FB Neck ROM: Full    Dental  (+) Teeth Intact, Dental Advisory Given, Loose,    Pulmonary    breath sounds clear to auscultation       Cardiovascular  Rhythm:Regular Rate:Normal     Neuro/Psych    GI/Hepatic   Endo/Other    Renal/GU      Musculoskeletal   Abdominal   Peds  Hematology   Anesthesia Other Findings   Reproductive/Obstetrics                             Anesthesia Physical Anesthesia Plan  ASA: II  Anesthesia Plan: General   Post-op Pain Management:    Induction: Intravenous  Airway Management Planned: Oral ETT  Additional Equipment:   Intra-op Plan:   Post-operative Plan:   Informed Consent: I have reviewed the patients History and Physical, chart, labs and discussed the procedure including the risks, benefits and alternatives for the proposed anesthesia with the patient or authorized representative who has indicated his/her understanding and acceptance.   Dental advisory given  Plan Discussed with: CRNA and Anesthesiologist  Anesthesia Plan Comments:         Anesthesia Quick Evaluation

## 2016-04-08 NOTE — Progress Notes (Signed)
Interpreter Wyvonnia DuskyGraciela Namihira for [employer]

## 2016-04-08 NOTE — Transfer of Care (Signed)
Immediate Anesthesia Transfer of Care Note  Patient: Joshua Patrick  Procedure(s) Performed: Procedure(s): IRRIGATION AND DEBRIDEMENT  RIGHT GLUTEAL ABSCESS WITH CULTURES (Right)  Patient Location: PACU  Anesthesia Type:General  Level of Consciousness: awake, alert  and oriented  Airway & Oxygen Therapy: Patient Spontanous Breathing  Post-op Assessment: Report given to RN and Post -op Vital signs reviewed and stable  Post vital signs: Reviewed and stable  Last Vitals:  Filed Vitals:   04/07/16 2332 04/08/16 0006  BP:  143/70  Pulse:  93  Temp: 37.4 C 36.9 C  Resp:  18    Last Pain:  Filed Vitals:   04/08/16 0007  PainSc: 5          Complications: No apparent anesthesia complications

## 2016-04-09 ENCOUNTER — Encounter (HOSPITAL_COMMUNITY): Payer: Self-pay | Admitting: General Practice

## 2016-04-09 LAB — CBC
HCT: 42.4 % (ref 39.0–52.0)
Hemoglobin: 14 g/dL (ref 13.0–17.0)
MCH: 30.4 pg (ref 26.0–34.0)
MCHC: 33 g/dL (ref 30.0–36.0)
MCV: 92.2 fL (ref 78.0–100.0)
PLATELETS: 368 10*3/uL (ref 150–400)
RBC: 4.6 MIL/uL (ref 4.22–5.81)
RDW: 12.3 % (ref 11.5–15.5)
WBC: 11.9 10*3/uL — ABNORMAL HIGH (ref 4.0–10.5)

## 2016-04-09 LAB — BASIC METABOLIC PANEL
ANION GAP: 8 (ref 5–15)
BUN: <5 mg/dL — ABNORMAL LOW (ref 6–20)
CO2: 26 mmol/L (ref 22–32)
Calcium: 8.7 mg/dL — ABNORMAL LOW (ref 8.9–10.3)
Chloride: 107 mmol/L (ref 101–111)
Creatinine, Ser: 0.84 mg/dL (ref 0.61–1.24)
GFR calc Af Amer: >60 mL/min (ref >60)
Glucose, Bld: 106 mg/dL — ABNORMAL HIGH (ref 65–99)
POTASSIUM: 4 mmol/L (ref 3.5–5.1)
SODIUM: 141 mmol/L (ref 135–145)

## 2016-04-09 MED ORDER — SODIUM CHLORIDE 0.9% FLUSH: 3.0000 mL | INTRAVENOUS | Status: DC | PRN

## 2016-04-09 MED ORDER — SODIUM CHLORIDE 0.9% FLUSH
3.0000 mL | Freq: Two times a day (BID) | INTRAVENOUS | Status: DC
  Administered 2016-04-09: 3 mL via INTRAVENOUS

## 2016-04-09 NOTE — Progress Notes (Signed)
Patient ID: Joshua Patrick, male   DOB: January 20, 1971, 45 y.o.   MRN: [id number]     [facility]      [address] [place], [place] [address]    Phone: [phone] FAX: [phone]     Subjective: Sore. WBC down.  Afebrile.  VSS.  Cx with GPC  Objective:  Vital signs:  Filed Vitals:   04/08/16 1300 04/08/16 2148 04/08/16 2251 04/09/16 0600  BP: 127/68  125/69 118/59  Pulse: 93  68 75  Temp: 99.7 F (37.6 C) 97.7 F (36.5 C) 98.1 F (36.7 C) 98.3 F (36.8 C)  TempSrc:   Oral   Resp: '18  17 17  ' Height:      Weight:      SpO2: 98%  95% 95%    Last BM Date: 04/08/16  Intake/Output   Yesterday:  05/02 0701 - 05/03 0700 In: 1910 [P.O.:1160; IV Piggyback:750] Out: 9528 [Urine:3550] This shift:    I/O last 3 completed shifts: In: 5295.8 [P.O.:1210; I.V.:2835.8; IV Piggyback:1250] Out: 4100 [Urine:4090; Blood:10] Total I/O In: 330 [P.O.:330] Out: 350 [Urine:350]  Physical Exam: General: Pt awake/alert/oriented x4 in no  acute distress Skin: iodoform removed, very tender.  No erythema, but induration to lateral gluteus.  Wound is clean.  Packing replaced with kerlix.   Problem List:   Active Problems:   Gluteal abscess    Results:   Labs: Results for orders placed or performed during the hospital encounter of 04/07/16 (from the past 48 hour(s))  Comprehensive metabolic panel     Status: None   Collection Time: 04/07/16  5:20 PM  Result Value Ref Range   Sodium 138 135 - 145 mmol/L   Potassium 3.6 3.5 - 5.1 mmol/L   Chloride 103 101 - 111 mmol/L   CO2 23 22 - 32 mmol/L   Glucose, Bld 92 65 - 99 mg/dL   BUN 9 6 - 20 mg/dL   Creatinine, Ser 0.86 0.61 - 1.24 mg/dL   Calcium 9.4 8.9 - 10.3 mg/dL   Total Protein 7.7 6.5 - 8.1 g/dL   Albumin 4.0 3.5 - 5.0 g/dL   AST 21 15 - 41 U/L   ALT 31 17 - 63 U/L   Alkaline Phosphatase 74 38 - 126 U/L   Total Bilirubin 1.2 0.3 - 1.2 mg/dL   GFR calc non Af Amer >60  >60 mL/min   GFR calc Af Amer >60 >60 mL/min    Comment: (NOTE) The eGFR has been calculated using the CKD EPI equation. This calculation has not been validated in all clinical situations. eGFR's persistently <60 mL/min signify possible Chronic Kidney Disease.    Anion gap 12 5 - 15  CBC with Differential     Status: Abnormal   Collection Time: 04/07/16  5:20 PM  Result Value Ref Range   WBC 15.8 (H) 4.0 - 10.5 K/uL   RBC 5.09 4.22 - 5.81 MIL/uL   Hemoglobin 15.5 13.0 - 17.0 g/dL   HCT 45.5 39.0 - 52.0 %   MCV 89.4 78.0 - 100.0 fL   MCH 30.5 26.0 - 34.0 pg   MCHC 34.1 30.0 - 36.0 g/dL   RDW 12.2 11.5 - 15.5 %   Platelets 355 150 - 400 K/uL   Neutrophils Relative % 81 %   Neutro Abs 12.9 (H) 1.7 - 7.7 K/uL   Lymphocytes Relative 10 %   Lymphs Abs 1.6 0.7 - 4.0 K/uL   Monocytes Relative 8 %  Monocytes Absolute 1.3 (H) 0.1 - 1.0 K/uL   Eosinophils Relative 0 %   Eosinophils Absolute 0.0 0.0 - 0.7 K/uL   Basophils Relative 0 %   Basophils Absolute 0.0 0.0 - 0.1 K/uL  I-Stat CG4 Lactic Acid, ED     Status: None   Collection Time: 04/07/16  5:56 PM  Result Value Ref Range   Lactic Acid, Venous 1.96 0.5 - 2.0 mmol/L  Culture, blood (routine x 2)     Status: None (Preliminary result)   Collection Time: 04/07/16  7:24 PM  Result Value Ref Range   Specimen Description BLOOD RIGHT ANTECUBITAL    Special Requests BOTTLES DRAWN AEROBIC AND ANAEROBIC 5CC    Culture NO GROWTH < 24 HOURS    Report Status PENDING   Culture, blood (routine x 2)     Status: None (Preliminary result)   Collection Time: 04/07/16  7:26 PM  Result Value Ref Range   Specimen Description BLOOD RIGHT HAND    Special Requests BOTTLES DRAWN AEROBIC AND ANAEROBIC 5CC    Culture NO GROWTH < 24 HOURS    Report Status PENDING   CG4 I-STAT (Lactic acid)     Status: None   Collection Time: 04/07/16  9:25 PM  Result Value Ref Range   Lactic Acid, Venous 1.24 0.5 - 2.0 mmol/L  CG4 I-STAT (Lactic acid)     Status:  None   Collection Time: 04/07/16 11:30 PM  Result Value Ref Range   Lactic Acid, Venous 1.06 0.5 - 2.0 mmol/L  Surgical pcr screen     Status: None   Collection Time: 04/08/16 12:07 AM  Result Value Ref Range   MRSA, PCR NEGATIVE NEGATIVE   Staphylococcus aureus NEGATIVE NEGATIVE    Comment:        The Xpert SA Assay (FDA approved for NASAL specimens in patients over 48 years of age), is one component of a comprehensive surveillance program.  Test performance has been validated by [facility] for patients greater than or equal to 47 year old. It is not intended to diagnose infection nor to guide or monitor treatment.   Culture, routine-abscess     Status: None (Preliminary result)   Collection Time: 04/08/16  2:35 AM  Result Value Ref Range   Specimen Description ABSCESS    Special Requests RIGHT BUTTOCK    Gram Stain      ABUNDANT WBC PRESENT,BOTH PMN AND MONONUCLEAR NO SQUAMOUS EPITHELIAL CELLS SEEN ABUNDANT GRAM POSITIVE COCCI IN PAIRS IN CLUSTERS Performed at [facility]    Culture PENDING    Report Status PENDING   Basic metabolic panel     Status: Abnormal   Collection Time: 04/08/16  5:03 AM  Result Value Ref Range   Sodium 136 135 - 145 mmol/L   Potassium 3.4 (L) 3.5 - 5.1 mmol/L   Chloride 104 101 - 111 mmol/L   CO2 23 22 - 32 mmol/L   Glucose, Bld 120 (H) 65 - 99 mg/dL   BUN 8 6 - 20 mg/dL   Creatinine, Ser 0.83 0.61 - 1.24 mg/dL   Calcium 8.4 (L) 8.9 - 10.3 mg/dL   GFR calc non Af Amer >60 >60 mL/min   GFR calc Af Amer >60 >60 mL/min    Comment: (NOTE) The eGFR has been calculated using the CKD EPI equation. This calculation has not been validated in all clinical situations. eGFR's persistently <60 mL/min signify possible Chronic Kidney Disease.    Anion gap 9 5 -  15  CBC     Status: Abnormal   Collection Time: 04/08/16  5:03 AM  Result Value Ref Range   WBC 17.0 (H) 4.0 - 10.5 K/uL   RBC 4.17 (L) 4.22 - 5.81 MIL/uL   Hemoglobin 12.7  (L) 13.0 - 17.0 g/dL   HCT 38.2 (L) 39.0 - 52.0 %   MCV 91.6 78.0 - 100.0 fL   MCH 30.5 26.0 - 34.0 pg   MCHC 33.2 30.0 - 36.0 g/dL   RDW 12.2 11.5 - 15.5 %   Platelets 311 150 - 400 K/uL  CBC     Status: Abnormal   Collection Time: 04/09/16  3:59 AM  Result Value Ref Range   WBC 11.9 (H) 4.0 - 10.5 K/uL   RBC 4.60 4.22 - 5.81 MIL/uL   Hemoglobin 14.0 13.0 - 17.0 g/dL   HCT 42.4 39.0 - 52.0 %   MCV 92.2 78.0 - 100.0 fL   MCH 30.4 26.0 - 34.0 pg   MCHC 33.0 30.0 - 36.0 g/dL   RDW 12.3 11.5 - 15.5 %   Platelets 368 150 - 400 K/uL  Basic metabolic panel     Status: Abnormal   Collection Time: 04/09/16  3:59 AM  Result Value Ref Range   Sodium 141 135 - 145 mmol/L   Potassium 4.0 3.5 - 5.1 mmol/L   Chloride 107 101 - 111 mmol/L   CO2 26 22 - 32 mmol/L   Glucose, Bld 106 (H) 65 - 99 mg/dL   BUN <5 (L) 6 - 20 mg/dL   Creatinine, Ser 0.84 0.61 - 1.24 mg/dL   Calcium 8.7 (L) 8.9 - 10.3 mg/dL   GFR calc non Af Amer >60 >60 mL/min   GFR calc Af Amer >60 >60 mL/min    Comment: (NOTE) The eGFR has been calculated using the CKD EPI equation. This calculation has not been validated in all clinical situations. eGFR's persistently <60 mL/min signify possible Chronic Kidney Disease.    Anion gap 8 5 - 15    Imaging / Studies: Ct Abdomen Pelvis W Contrast  04/07/2016  CLINICAL DATA:  Gluteal abscess.  Concern for rectal involvement. EXAM: CT ABDOMEN AND PELVIS WITH CONTRAST TECHNIQUE: Multidetector CT imaging of the abdomen and pelvis was performed using the standard protocol following bolus administration of intravenous contrast. CONTRAST:  169m ISOVUE-300 IOPAMIDOL (ISOVUE-300) INJECTION 61% COMPARISON:  06/11/2015 FINDINGS: Lower chest:  No significant abnormality Hepatobiliary: There are normal appearances of the liver, gallbladder and bile ducts. Pancreas: Normal Spleen: Normal Adrenals/Urinary Tract: The adrenals and kidneys are normal in appearance. There is no urinary calculus  evident. There is no hydronephrosis or ureteral dilatation. Collecting systems and ureters appear unremarkable. Stomach/Bowel: There are normal appearances of the stomach, small bowel and colon. The appendix is normal. Vascular/Lymphatic: The abdominal aorta is normal in caliber. There is no atherosclerotic calcification. There is no adenopathy in the abdomen or pelvis. Reproductive: Unremarkable Other: There is a 6.1 x 2.6 by 4.3 cm right gluteal collection with surrounding inflammatory stranding, consistent with an abscess. There is no evidence of direct communication with the anus or rectum although the collection does extend to the posterior attachment of the right levator ani. No extension deep into the pelvis. Ischiorectal fossa appears uninvolved. No evidence of extension to the coccygeal tip. Musculoskeletal: No significant skeletal lesion. IMPRESSION: Right gluteal collection with surrounding inflammation and skin thickening, consistent with an abscess. This is mildly larger than on 06/11/2015. No evidence of direct communication with  the anus or rectum. No extension into the pelvis. Electronically Signed   By: Andreas Newport M.D.   On: 04/07/2016 19:56    Medications / Allergies:  Scheduled Meds: . docusate sodium  100 mg Oral BID  . heparin subcutaneous  5,000 Units Subcutaneous Q8H  . piperacillin-tazobactam (ZOSYN)  IV  3.375 g Intravenous Q8H  . polyethylene glycol  17 g Oral Daily  . vancomycin  1,000 mg Intravenous Q8H   Continuous Infusions: . dextrose 5 % and 0.45 % NaCl with KCl 20 mEq/L 50 mL/hr at 04/08/16 0330   PRN Meds:.acetaminophen, diphenhydrAMINE **OR** diphenhydrAMINE, ibuprofen, morphine injection, ondansetron **OR** ondansetron (ZOFRAN) IV, oxyCODONE  Antibiotics: Anti-infectives    Start     Dose/Rate Route Frequency Ordered Stop   04/08/16 0000  piperacillin-tazobactam (ZOSYN) IVPB 3.375 g     3.375 g 12.5 mL/hr over 240 Minutes Intravenous Every 8 hours  04/07/16 2357     04/07/16 2000  vancomycin (VANCOCIN) IVPB 1000 mg/200 mL premix     1,000 mg 200 mL/hr over 60 Minutes Intravenous Every 8 hours 04/07/16 1908          Assessment/Plan POD#1 I&D gluteal abscess--Dr. Redmond Pulling -will continue inpatient today for dressing changes and pain control -sitz baths -HH for dressing changes  ID-narrow atbx to zosyn FEN-no issues VTE prophylaxis-SCD/heparin  Dispo-anticipate DC in AM  Erby Pian, [facility] Pager [phone](7A-4:30P) For consults and floor pages call [phone](7A-4:30P)  04/09/2016 2:11 PM

## 2016-04-09 NOTE — Progress Notes (Signed)
Interpreter Graciela Namihira for RN Nancy. Admiting 

## 2016-04-10 ENCOUNTER — Encounter (HOSPITAL_COMMUNITY): Payer: Self-pay | Admitting: General Surgery

## 2016-04-10 LAB — CULTURE, ROUTINE-ABSCESS

## 2016-04-10 MED ORDER — POLYETHYLENE GLYCOL 3350 17 G PO PACK: 17.0000 g | PACK | Freq: Every day | ORAL | Status: AC

## 2016-04-10 MED ORDER — DOCUSATE SODIUM 100 MG PO CAPS: 100.0000 mg | ORAL_CAPSULE | Freq: Two times a day (BID) | ORAL | Status: AC

## 2016-04-10 MED ORDER — SULFAMETHOXAZOLE-TRIMETHOPRIM 800-160 MG PO TABS: 1.0000 | ORAL_TABLET | Freq: Two times a day (BID) | ORAL | Status: DC

## 2016-04-10 MED ORDER — OXYCODONE-ACETAMINOPHEN 5-325 MG PO TABS: 1.0000 | ORAL_TABLET | Freq: Four times a day (QID) | ORAL | Status: AC | PRN

## 2016-04-10 MED ORDER — OXYCODONE-ACETAMINOPHEN 5-325 MG PO TABS: 1.0000 | ORAL_TABLET | Freq: Four times a day (QID) | ORAL | Status: DC | PRN

## 2016-04-10 NOTE — Care Management Note (Addendum)
Case Management Note  Patient Details  Name: Joshua Patrick MRN: [id number] Date of Birth: 14-Jun-1971  Subjective/Objective:  45 yr old male  S/p I & D of right buttock abscess.                  Action/Plan: Case manager spoke with patient at bedside concerning need for [facility] for dressing changes. CM and bedside RN explained that patient's wife will have to learn how to do dressing changes because HHRN will not be coming daily or twice a day. Patient appears to understand. 10:51am Leonie DouglasGarcela- spanish interpreter is now present, Case manager went over explaination of plans, and also provided patient with [facility] [phone]. Patient states through interpreter that he [facility]. CM has already informed Tamala BariMary Manley, [facility] of patient's lack of insurance. Patient's wife will be available to be taught dressing change procedure by bedside RN today prior to discharge. [facility], Case manager has been asked if we can send supplies home with patient to facilitate dressings. CM will arrange with Bedside RN and unit director.  Expected Discharge Date:   04/10/16               Expected Discharge Plan:  Home w Home Health Services  In-House Referral:     Discharge planning Services  CM Consult  Post Acute Care Choice:  Home Health Choice offered to:  Patient  DME Arranged:  N/A DME Agency:  NA  HH Arranged:    HH Agency:  [facility]  Status of Service:  In process, will continue to follow  Medicare Important Message Given:    Date Medicare IM Given:    Medicare IM give by:    Date Additional Medicare IM Given:    Additional Medicare Important Message give by:     If discussed at Long Length of Stay Meetings, dates discussed:    Additional Comments:  Durenda GuthrieBrady, Kruz Chiu Naomi, RN 04/10/2016, 10:33 AM

## 2016-04-10 NOTE — Progress Notes (Signed)
Interpreter Ashby DawesGraciela for [place] Ocean ViewEmina NP

## 2016-04-10 NOTE — Progress Notes (Signed)
Pt is ready for d/c home per MD. Discharge instructions regarding wound care and prescriptions were reviewed with pt and his wife with Ashby DawesGraciela, interpreter. All questions were answered. Pt was given at least a weeks worth of dressing supplies, as approved by DD. Peripheral IV's removed, and belongings sent with pt. Assisted to car by NT.   [place], Latricia HeftKorie G

## 2016-04-10 NOTE — Discharge Instructions (Signed)

## 2016-04-10 NOTE — Progress Notes (Signed)
Interpreter Wyvonnia DuskyGraciela Namihira for Michelle/Korie

## 2016-04-10 NOTE — Discharge Summary (Signed)
Physician Discharge Summary  Joshua Patrick FAO:[id number] DOB: 01/23/1971 DOA: 04/07/2016  PCP: Lora Paula, MD  Consultation: none  Admit date: 04/07/2016 Discharge date: 04/10/2016  Recommendations for Outpatient Follow-up:   Follow-up Information    Follow up with Atilano Ina, MD In 2 weeks.   Specialty:  General Surgery   Why:  For wound re-check   Contact information:   [address] [place] [address] [phone]      Discharge Diagnoses:  1. Gluteal abscess   Surgical Procedure: I&D gluteal abscess--Dr. Andrey Campanile  Discharge Condition: stable Disposition: home  Diet recommendation: regular  Filed Weights   04/07/16 1900  Weight: 87.998 kg (194 lb)     Filed Vitals:   04/09/16 2100 04/10/16 0500  BP: 130/67 125/73  Pulse: 60 60  Temp: 97.7 F (36.5 C) 97.6 F (36.4 C)  Resp: 18 18     Hospital Course:  Joshua Patrick presented with buttock pain.  He was found to have a gluteal abscess that required I&D in the Or.  He tolerated the procedure well and was transferred to the floor.  On POD#1 he had increased pain with dressing changes and was kept in the hospital.  Cultures yielded GPC to date and antibiotics were narrowed to zosyn and at discharge given 5 days of septra.  His wife has done wet to dry dressing changes in the past and will help at home.  On POD#2 he was felt stable for discharge. We reviewed his instructions with Joshua Patrick the interpreter.  He verbalizes understanding.  We discussed warning signs that warrant further evaluation.     Physical Exam: General: Pt awake/alert/oriented x4 in no acute distress Skin: no induration or erythema, tender, but tolerated dressing change.  Wound is clean.  Serous drainage.  Pack with NS soaked 4x4.    Discharge Instructions     Medication List    TAKE these medications        docusate sodium 100 MG capsule  Commonly known as:  COLACE  Take 1 capsule (100 mg total) by  mouth 2 (two) times daily.     ibuprofen 200 MG tablet  Commonly known as:  ADVIL,MOTRIN  Take 400 mg by mouth every 6 (six) hours as needed for mild pain.     OVER THE COUNTER MEDICATION  Apply 1 application topically daily as needed (hemmroidal pain).     oxyCODONE-acetaminophen 5-325 MG tablet  Commonly known as:  ROXICET  Take 1 tablet by mouth every 6 (six) hours as needed.     polyethylene glycol packet  Commonly known as:  MIRALAX / GLYCOLAX  Take 17 g by mouth daily.     sulfamethoxazole-trimethoprim 800-160 MG tablet  Commonly known as:  BACTRIM DS,SEPTRA DS  Take 1 tablet by mouth 2 (two) times daily.           Follow-up Information    Follow up with Atilano Ina, MD In 2 weeks.   Specialty:  General Surgery   Why:  For wound re-check   Contact information:   [address] [place] [address] [phone]        The results of significant diagnostics from this hospitalization (including imaging, microbiology, ancillary and laboratory) are listed below for reference.    Significant Diagnostic Studies: Ct Abdomen Pelvis W Contrast  04/07/2016  CLINICAL DATA:  Gluteal abscess.  Concern for rectal involvement. EXAM: CT ABDOMEN AND PELVIS WITH CONTRAST TECHNIQUE: Multidetector CT imaging of the  abdomen and pelvis was performed using the standard protocol following bolus administration of intravenous contrast. CONTRAST:  ISOVUE-300 IOPAMIDOL (ISOVUE-300) INJECTION 61% COMPARISON:  06/11/2015 FINDINGS: Lower chest:  No significant abnormality Hepatobiliary: There are normal appearances of the liver, gallbladder and bile ducts. Pancreas: Normal Spleen: Normal Adrenals/Urinary Tract: The adrenals and kidneys are normal in appearance. There is no urinary calculus evident. There is no hydronephrosis or ureteral dilatation. Collecting systems and ureters appear unremarkable. Stomach/Bowel: There are normal appearances of the stomach, small bowel and colon.  The appendix is normal. Vascular/Lymphatic: The abdominal aorta is normal in caliber. There is no atherosclerotic calcification. There is no adenopathy in the abdomen or pelvis. Reproductive: Unremarkable Other: There is a 6.1 x 2.6 by 4.3 cm right gluteal collection with surrounding inflammatory stranding, consistent with an abscess. There is no evidence of direct communication with the anus or rectum although the collection does extend to the posterior attachment of the right levator ani. No extension deep into the pelvis. Ischiorectal fossa appears uninvolved. No evidence of extension to the coccygeal tip. Musculoskeletal: No significant skeletal lesion. IMPRESSION: Right gluteal collection with surrounding inflammation and skin thickening, consistent with an abscess. This is mildly larger than on 06/11/2015. No evidence of direct communication with the anus or rectum. No extension into the pelvis. Electronically Signed   By: Ellery Plunk M.D.   On: 04/07/2016 19:56    Microbiology: Recent Results (from the past 240 hour(s))  Culture, blood (routine x 2)     Status: None (Preliminary result)   Collection Time: 04/07/16  7:24 PM  Result Value Ref Range Status   Specimen Description BLOOD RIGHT ANTECUBITAL  Final   Special Requests BOTTLES DRAWN AEROBIC AND ANAEROBIC 5CC  Final   Culture NO GROWTH 2 DAYS  Final   Report Status PENDING  Incomplete  Culture, blood (routine x 2)     Status: None (Preliminary result)   Collection Time: 04/07/16  7:26 PM  Result Value Ref Range Status   Specimen Description BLOOD RIGHT HAND  Final   Special Requests BOTTLES DRAWN AEROBIC AND ANAEROBIC 5CC  Final   Culture NO GROWTH 2 DAYS  Final   Report Status PENDING  Incomplete  Surgical pcr screen     Status: None   Collection Time: 04/08/16 12:07 AM  Result Value Ref Range Status   MRSA, PCR NEGATIVE NEGATIVE Final   Staphylococcus aureus NEGATIVE NEGATIVE Final    Comment:        The Xpert SA Assay  (FDA approved for NASAL specimens in patients over 32 years of age), is one component of a comprehensive surveillance program.  Test performance has been validated by [facility] for patients greater than or equal to 58 year old. It is not intended to diagnose infection nor to guide or monitor treatment.   Culture, routine-abscess     Status: None (Preliminary result)   Collection Time: 04/08/16  2:35 AM  Result Value Ref Range Status   Specimen Description ABSCESS  Final   Special Requests RIGHT BUTTOCK  Final   Gram Stain   Final    ABUNDANT WBC PRESENT,BOTH PMN AND MONONUCLEAR NO SQUAMOUS EPITHELIAL CELLS SEEN ABUNDANT GRAM POSITIVE COCCI IN PAIRS IN CLUSTERS Performed at [employer] [facility]    Culture PENDING  Incomplete   Report Status PENDING  Incomplete     Labs: Basic Metabolic Panel:  Recent Labs Lab 04/07/16 1720 04/08/16 0503 04/09/16 0359  NA 138 136 141  K 3.6  3.4* 4.0  CL 103 104 107  CO2 23 23 26   GLUCOSE 92 120* 106*  BUN 9 8 <5*  CREATININE 0.86 0.83 0.84  CALCIUM 9.4 8.4* 8.7*   Liver Function Tests:  Recent Labs Lab 04/07/16 1720  AST 21  ALT 31  ALKPHOS 74  BILITOT 1.2  PROT 7.7  ALBUMIN 4.0   No results for input(s): LIPASE, AMYLASE in the last 168 hours. No results for input(s): AMMONIA in the last 168 hours. CBC:  Recent Labs Lab 04/07/16 1720 04/08/16 0503 04/09/16 0359  WBC 15.8* 17.0* 11.9*  NEUTROABS 12.9*  --   --   HGB 15.5 12.7* 14.0  HCT 45.5 38.2* 42.4  MCV 89.4 91.6 92.2  PLT 355 311 368   Cardiac Enzymes: No results for input(s): CKTOTAL, CKMB, CKMBINDEX, TROPONINI in the last 168 hours. BNP: BNP (last 3 results) No results for input(s): BNP in the last 8760 hours.  ProBNP (last 3 results) No results for input(s): PROBNP in the last 8760 hours.  CBG: No results for input(s): GLUCAP in the last 168 hours.  Active Problems:   Gluteal abscess   Signed:  Kasidy Gianino, ANP-BC

## 2016-04-12 LAB — ANAEROBIC CULTURE

## 2016-04-12 LAB — CULTURE, BLOOD (ROUTINE X 2)
CULTURE: NO GROWTH
Culture: NO GROWTH

## 2016-04-24 NOTE — Progress Notes (Signed)
Interpreter Graciela Namihira for RN discharge instructions °

## 2018-12-22 ENCOUNTER — Encounter (HOSPITAL_COMMUNITY): Payer: Self-pay | Admitting: Emergency Medicine

## 2018-12-22 ENCOUNTER — Other Ambulatory Visit: Payer: Self-pay

## 2018-12-22 ENCOUNTER — Emergency Department (HOSPITAL_COMMUNITY): Payer: Self-pay

## 2018-12-22 ENCOUNTER — Emergency Department (HOSPITAL_COMMUNITY)
Admission: EM | Admit: 2018-12-22 | Discharge: 2018-12-22 | Disposition: A | Discharge status: Home / Self Care | Payer: Self-pay | Attending: Emergency Medicine | Admitting: Emergency Medicine

## 2018-12-22 DIAGNOSIS — L0231 Cutaneous abscess of buttock: Secondary | ICD-10-CM | POA: Insufficient documentation

## 2018-12-22 DIAGNOSIS — L03317 Cellulitis of buttock: Secondary | ICD-10-CM | POA: Insufficient documentation

## 2018-12-22 LAB — CBC WITH DIFFERENTIAL/PLATELET
ABS IMMATURE GRANULOCYTES: 0.07 10*3/uL (ref 0.00–0.07)
BASOS PCT: 0 %
Basophils Absolute: 0.1 10*3/uL (ref 0.0–0.1)
Eosinophils Absolute: 0.2 10*3/uL (ref 0.0–0.5)
Eosinophils Relative: 1 %
HCT: 46 % (ref 39.0–52.0)
HEMOGLOBIN: 15.2 g/dL (ref 13.0–17.0)
Immature Granulocytes: 1 %
LYMPHS PCT: 17 %
Lymphs Abs: 2.6 10*3/uL (ref 0.7–4.0)
MCH: 31.1 pg (ref 26.0–34.0)
MCHC: 33 g/dL (ref 30.0–36.0)
MCV: 94.3 fL (ref 80.0–100.0)
MONO ABS: 1.8 10*3/uL — AB (ref 0.1–1.0)
MONOS PCT: 12 %
NEUTROS ABS: 10.7 10*3/uL — AB (ref 1.7–7.7)
Neutrophils Relative %: 69 %
Platelets: 294 10*3/uL (ref 150–400)
RBC: 4.88 MIL/uL (ref 4.22–5.81)
RDW: 12.7 % (ref 11.5–15.5)
WBC: 15.4 10*3/uL — ABNORMAL HIGH (ref 4.0–10.5)
nRBC: 0 % (ref 0.0–0.2)

## 2018-12-22 LAB — BASIC METABOLIC PANEL
Anion gap: 9 (ref 5–15)
BUN: 11 mg/dL (ref 6–20)
CO2: 24 mmol/L (ref 22–32)
CREATININE: 0.85 mg/dL (ref 0.61–1.24)
Calcium: 8.4 mg/dL — ABNORMAL LOW (ref 8.9–10.3)
Chloride: 105 mmol/L (ref 98–111)
GFR calc Af Amer: >60 mL/min (ref >60)
GLUCOSE: 104 mg/dL — AB (ref 70–99)
POTASSIUM: 3.8 mmol/L (ref 3.5–5.1)
Sodium: 138 mmol/L (ref 135–145)

## 2018-12-22 LAB — I-STAT CG4 LACTIC ACID, ED: Lactic Acid, Venous: 0.89 mmol/L (ref 0.5–1.9)

## 2018-12-22 MED ORDER — MORPHINE SULFATE (PF) 4 MG/ML IV SOLN
4.0000 mg | Freq: Once | INTRAVENOUS | Status: AC
  Administered 2018-12-22: 4 mg via INTRAVENOUS
  Filled 2018-12-22: qty 1

## 2018-12-22 MED ORDER — CEPHALEXIN 500 MG PO CAPS: 500.0000 mg | ORAL_CAPSULE | Freq: Four times a day (QID) | ORAL | 0 refills | Status: AC

## 2018-12-22 MED ORDER — SULFAMETHOXAZOLE-TRIMETHOPRIM 800-160 MG PO TABS
1.0000 | ORAL_TABLET | Freq: Once | ORAL | Status: AC
  Administered 2018-12-22: 1 via ORAL
  Filled 2018-12-22: qty 1

## 2018-12-22 MED ORDER — HYDROCODONE-ACETAMINOPHEN 5-325 MG PO TABS: 1.0000 | ORAL_TABLET | Freq: Four times a day (QID) | ORAL | 0 refills | Status: AC | PRN

## 2018-12-22 MED ORDER — SODIUM CHLORIDE (PF) 0.9 % IJ SOLN
INTRAMUSCULAR | Status: AC
  Filled 2018-12-22: qty 50

## 2018-12-22 MED ORDER — CEPHALEXIN 500 MG PO CAPS
500.0000 mg | ORAL_CAPSULE | Freq: Once | ORAL | Status: AC
  Administered 2018-12-22: 500 mg via ORAL
  Filled 2018-12-22: qty 1

## 2018-12-22 MED ORDER — SULFAMETHOXAZOLE-TRIMETHOPRIM 800-160 MG PO TABS: 1.0000 | ORAL_TABLET | Freq: Two times a day (BID) | ORAL | 0 refills | Status: AC

## 2018-12-22 MED ORDER — IOPAMIDOL (ISOVUE-300) INJECTION 61%
INTRAVENOUS | Status: AC
  Filled 2018-12-22: qty 100

## 2018-12-22 MED ORDER — IOPAMIDOL (ISOVUE-300) INJECTION 61%
100.0000 mL | Freq: Once | INTRAVENOUS | Status: AC | PRN
  Administered 2018-12-22: 100 mL via INTRAVENOUS

## 2018-12-22 NOTE — ED Triage Notes (Addendum)
Per family, patient c/o fever and rectal pain with swelling x2 days. Hx peri rectal abscess. Reports taking ibuprofen this morning. Patient is spanish speaking.

## 2018-12-22 NOTE — Discharge Instructions (Signed)
Toma todos los antibioticos.  Toma tylenol e ibuprofen por penqueno dolor. [place] norco por dolor serio. Ten cuidado cuando tomandolo, y no manejar.  Da [place] Dear cita con los doctors de [place] si no mejora. Regresa a la sala de emergencia si empeoran.

## 2018-12-22 NOTE — ED Provider Notes (Signed)
[facility]-EMERGENCY DEPT Provider Note   CSN: [id number] Arrival date & time: 12/22/18  1520     History   Chief Complaint Chief Complaint  Patient presents with  . Rectal Pain    HPI Joshua Patrick is a 48 y.o. male presenting for evaluation of rectal pain.  Patient states that the past 3 days, he has been having rectal pain.  Patient states he has a history of perirectal abscesses, drained once in the ER and wants an surgery.  Patient states he feels like he has been having fevers at home, but has not checked his temperature.  He denies chest pain, shortness of breath, cough, nausea, vomiting, abdominal pain, urinary symptoms, pain with bowel movements, or abnormal bowel movements.  Patient states he started taking amoxicillin, reports mild improvement of his symptoms.  He has taking Tylenol and ibuprofen for his fever and for pain.  He has no medical problems, takes no medications daily.  HPI  Past Medical History:  Diagnosis Date  . Hemorrhoids     Patient Active Problem List   Diagnosis Date Noted  . Gluteal abscess 04/07/2016  . Elevated BP 06/26/2015  . Abscess 06/11/2015    Past Surgical History:  Procedure Laterality Date  . HEMORRHOID SURGERY    . INCISION AND DRAINAGE PERIRECTAL ABSCESS Right 04/08/2016   Procedure: IRRIGATION AND DEBRIDEMENT  RIGHT GLUTEAL ABSCESS WITH CULTURES;  Surgeon: Gaynelle Adu, MD;  Location: [facility] OR;  Service: General;  Laterality: Right;  . IRRIGATION AND DEBRIDEMENT BUTTOCKS Right 06/12/2015   Procedure: IRRIGATION AND DEBRIDEMENT BUTTOCK ABSCESS;  Surgeon: Axel Filler, MD;  Location: [facility] OR;  Service: General;  Laterality: Right;        Home Medications    Prior to Admission medications   Medication Sig Start Date End Date Taking? Authorizing Provider  ibuprofen (ADVIL,MOTRIN) 200 MG tablet Take 400 mg by mouth every 6 (six) hours as needed for moderate pain.   Yes [provider]  cephALEXin  (KEFLEX) 500 MG capsule Take 1 capsule (500 mg total) by mouth 4 (four) times daily for 7 days. 12/22/18 12/29/18  Ramiya Delahunty, PA-C  docusate sodium (COLACE) 100 MG capsule Take 1 capsule (100 mg total) by mouth 2 (two) times daily. Patient not taking: Reported on 12/22/2018 04/10/16   Ashok Norris, NP  HYDROcodone-acetaminophen (NORCO/VICODIN) 5-325 MG tablet Take 1 tablet by mouth every 6 (six) hours as needed for severe pain. 12/22/18   Areona Homer, PA-C  polyethylene glycol (MIRALAX / GLYCOLAX) packet Take 17 g by mouth daily. Patient not taking: Reported on 12/22/2018 04/10/16   Ashok Norris, NP  sulfamethoxazole-trimethoprim (BACTRIM DS,SEPTRA DS) 800-160 MG tablet Take 1 tablet by mouth 2 (two) times daily for 7 days. 12/22/18 12/29/18  Curry Seefeldt, PA-C    Family History Family History  Problem Relation Age of Onset  . Hypertension Mother     Social History Social History   Tobacco Use  . Smoking status: Never Smoker  . Smokeless tobacco: Never Used  Substance Use Topics  . Alcohol use: No  . Drug use: No     Allergies   Patient has no known allergies.   Review of Systems Review of Systems  Constitutional: Positive for fever.  Gastrointestinal: Positive for rectal pain.  All other systems reviewed and are negative.    Physical Exam Updated Vital Signs BP 132/76   Pulse 90   Temp 98.5 F (36.9 C) (Oral)   Resp 16   Ht 5'  6" (1.676 m)   Wt 94.3 kg   SpO2 96%   BMI 33.57 kg/m   Physical Exam Vitals signs and nursing note reviewed. Exam conducted with a chaperone present.  Constitutional:      General: He is not in acute distress.    Appearance: He is well-developed.     Comments: Appear nontoxic  HENT:     Head: Normocephalic and atraumatic.  Eyes:     Conjunctiva/sclera: Conjunctivae normal.     Pupils: Pupils are equal, round, and reactive to light.  Neck:     Musculoskeletal: Normal range of motion and neck supple.  Cardiovascular:       Rate and Rhythm: Normal rate and regular rhythm.  Pulmonary:     Effort: Pulmonary effort is normal. No respiratory distress.     Breath sounds: Normal breath sounds. No wheezing.  Abdominal:     General: Bowel sounds are normal. There is no distension.     Palpations: Abdomen is soft.     Tenderness: There is no abdominal tenderness.  Genitourinary:   Musculoskeletal: Normal range of motion.  Skin:    General: Skin is warm and dry.  Neurological:     Mental Status: He is alert and oriented to person, place, and time.      ED Treatments / Results  Labs (all labs ordered are listed, but only abnormal results are displayed) Labs Reviewed  BASIC METABOLIC PANEL - Abnormal; Notable for the following components:      Result Value   Glucose, Bld 104 (*)    Calcium 8.4 (*)    All other components within normal limits  CBC WITH DIFFERENTIAL/PLATELET - Abnormal; Notable for the following components:   WBC 15.4 (*)    Neutro Abs 10.7 (*)    Monocytes Absolute 1.8 (*)    All other components within normal limits  I-STAT CG4 LACTIC ACID, ED    EKG None  Radiology Ct Pelvis W Contrast  Result Date: 12/22/2018 CLINICAL DATA:  Fever and rectal pain. EXAM: CT PELVIS WITH CONTRAST TECHNIQUE: Multidetector CT imaging of the pelvis was performed using the standard protocol following the bolus administration of intravenous contrast. CONTRAST:  100 mL ISOVUE-300 IOPAMIDOL (ISOVUE-300) INJECTION 61% COMPARISON:  CT scan of Apr 07, 2016. FINDINGS: Urinary Tract:  No abnormality visualized. Bowel:  Unremarkable visualized pelvic bowel loops. Vascular/Lymphatic: No pathologically enlarged lymph nodes. No significant vascular abnormality seen. Reproductive:  No mass or other significant abnormality Other: 2.4 x 1.6 cm fluid collection is seen in the medial portion of the right gluteal region concerning for abscess. Mild surrounding inflammatory changes are noted concerning for cellulitis.  Musculoskeletal: No suspicious bone lesions identified. IMPRESSION: 2.4 x 1.6 cm fluid collection is seen in the medial portion of the right gluteal and buttocks region concerning for abscess. Electronically Signed   By: Lupita Raider, M.D.   On: 12/22/2018 20:35    Procedures Procedures (including critical care time)  Medications Ordered in ED Medications  sodium chloride (PF) 0.9 % injection (has no administration in time range)  morphine 4 MG/ML injection 4 mg (4 mg Intravenous Given 12/22/18 1853)  iopamidol (ISOVUE-300) 61 % injection 100 mL (100 mLs Intravenous Contrast Given 12/22/18 1958)  sulfamethoxazole-trimethoprim (BACTRIM DS,SEPTRA DS) 800-160 MG per tablet 1 tablet (1 tablet Oral Given 12/22/18 2205)  cephALEXin (KEFLEX) capsule 500 mg (500 mg Oral Given 12/22/18 2205)     Initial Impression / Assessment and Plan / ED Course  I have  reviewed the triage vital signs and the nursing notes.  Pertinent labs & imaging results that were available during my care of the patient were reviewed by me and considered in my medical decision making (see chart for details).     Patient presenting for evaluation of rectal pain.  Physical exam shows erythema, warmth, fluctuance of the right buttock.  He has needed I&D in the OR before.  Additionally, reporting fevers at home, although nausea, vomiting, abdominal pain, or signs of sepsis at this time.  White count elevated at 15, otherwise labs are reassuring.  Lactic is negative.  Patient is afebrile in the department.  Will give morphine for pain control, and obtain CT pelvis for further evaluation.  Pain improved with morphine.  CT shows a gluteal abscess without involvement of the finger.  Discussed findings with patient.  Discussed ideal treatment of bedside I&D with antibiotics.  Patient does not want bedside I&D at this time.  Discussed use of antibiotics at home, and close follow-up with surgery as needed.  First dose of antibiotics given  in the ED.  At this time, patient appears safe for discharge.  Return precautions given.  Patient states he understands and agrees to plan.   Final Clinical Impressions(s) / ED Diagnoses   Final diagnoses:  Abscess of buttock, right  Cellulitis of buttock    ED Discharge Orders         Ordered    cephALEXin (KEFLEX) 500 MG capsule  4 times daily     12/22/18 2142    sulfamethoxazole-trimethoprim (BACTRIM DS,SEPTRA DS) 800-160 MG tablet  2 times daily     12/22/18 2142    HYDROcodone-acetaminophen (NORCO/VICODIN) 5-325 MG tablet  Every 6 hours PRN     12/22/18 2142           Alveria ApleyCaccavale, Ioan Landini, PA-C 12/22/18 2340    Benjiman CorePickering, Nathan, MD 12/26/18 1459

## 2018-12-23 ENCOUNTER — Emergency Department (HOSPITAL_COMMUNITY)
Admission: EM | Admit: 2018-12-23 | Discharge: 2018-12-23 | Disposition: A | Discharge status: Home / Self Care | Payer: Self-pay | Attending: Emergency Medicine | Admitting: Emergency Medicine

## 2018-12-23 DIAGNOSIS — T7840XA Allergy, unspecified, initial encounter: Secondary | ICD-10-CM | POA: Insufficient documentation

## 2018-12-23 DIAGNOSIS — L0231 Cutaneous abscess of buttock: Secondary | ICD-10-CM | POA: Insufficient documentation

## 2018-12-23 DIAGNOSIS — R21 Rash and other nonspecific skin eruption: Secondary | ICD-10-CM | POA: Insufficient documentation

## 2018-12-23 DIAGNOSIS — Z79899 Other long term (current) drug therapy: Secondary | ICD-10-CM | POA: Insufficient documentation

## 2018-12-23 MED ORDER — FAMOTIDINE 20 MG PO TABS
20.0000 mg | ORAL_TABLET | Freq: Once | ORAL | Status: AC
  Administered 2018-12-23: 20 mg via ORAL
  Filled 2018-12-23: qty 1

## 2018-12-23 MED ORDER — NAPROXEN 500 MG PO TABS: 500.0000 mg | ORAL_TABLET | Freq: Two times a day (BID) | ORAL | 0 refills | Status: AC

## 2018-12-23 MED ORDER — DIPHENHYDRAMINE HCL 25 MG PO CAPS
50.0000 mg | ORAL_CAPSULE | Freq: Once | ORAL | Status: AC
  Administered 2018-12-23: 50 mg via ORAL
  Filled 2018-12-23: qty 2

## 2018-12-23 MED ORDER — FAMOTIDINE 20 MG PO TABS: 20.0000 mg | ORAL_TABLET | Freq: Two times a day (BID) | ORAL | 0 refills | Status: AC

## 2018-12-23 MED ORDER — CLINDAMYCIN HCL 150 MG PO CAPS: 300.0000 mg | ORAL_CAPSULE | Freq: Four times a day (QID) | ORAL | 0 refills | Status: AC

## 2018-12-23 NOTE — ED Provider Notes (Signed)
[facility] Provider Note   CSN: [id number] Arrival date & time: 12/23/18  1555   History   Chief Complaint Chief Complaint  Patient presents with  . Allergic Reaction    HPI Joshua Patrick is a 48 y.o. male presenting with a rash on his chest onset today at 1am. Patient states he was [facility] at [facility] for an [facility] and states he was given two antibiotics (Keflex and Bactrim) in the ER yesterday and prescribed the same antibiotics for outpatient treatment. Patient refused I&D yesterday and wanted to proceed with only antibiotics as outpatient. Patient states he did not continue to take antibiotics today. Patient describes rash as red and itchy on his chest and states he had right eye edema earlier in the day. Patient reports he took Benadryl with improvement. Patient denies shortness of breath, difficulty breathing/swallowing, or current facial edema. Patient denies any worsening of the abscess since last evaluated. Patient denies fever, nausea, abdominal pain, or vomiting.   HPI  Past Medical History:  Diagnosis Date  . Hemorrhoids     Patient Active Problem List   Diagnosis Date Noted  . Gluteal abscess 04/07/2016  . Elevated BP 06/26/2015  . Abscess 06/11/2015    Past Surgical History:  Procedure Laterality Date  . HEMORRHOID SURGERY    . INCISION AND DRAINAGE PERIRECTAL ABSCESS Right 04/08/2016   Procedure: IRRIGATION AND DEBRIDEMENT  RIGHT GLUTEAL ABSCESS WITH CULTURES;  Surgeon: Gaynelle AduEric Wilson, MD;  [facility];  Service: General;  Laterality: Right;  . IRRIGATION AND DEBRIDEMENT BUTTOCKS Right 06/12/2015   Procedure: IRRIGATION AND DEBRIDEMENT BUTTOCK ABSCESS;  Surgeon: Axel FillerArmando Ramirez, MD;  Location: [facility] OR;  Service: General;  Laterality: Right;        Home Medications    Prior to Admission medications   Medication Sig Start Date End Date Taking? Authorizing Provider  cephALEXin (KEFLEX) 500 MG capsule  Take 1 capsule (500 mg total) by mouth 4 (four) times daily for 7 days. 12/22/18 12/29/18  Caccavale, Sophia, PA-C  clindamycin (CLEOCIN) 150 MG capsule Take 2 capsules (300 mg total) by mouth every 6 (six) hours for 10 days. 12/23/18 01/02/19  Carlyle BasquesHernandez, Antoniette Peake P, PA-C  docusate sodium (COLACE) 100 MG capsule Take 1 capsule (100 mg total) by mouth 2 (two) times daily. Patient not taking: Reported on 12/22/2018 04/10/16   Ashok Norrisiebock, Emina, NP  famotidine (PEPCID) 20 MG tablet Take 1 tablet (20 mg total) by mouth 2 (two) times daily for 7 days. 12/23/18 12/30/18  Carlyle BasquesHernandez, Emmett Bracknell P, PA-C  HYDROcodone-acetaminophen (NORCO/VICODIN) 5-325 MG tablet Take 1 tablet by mouth every 6 (six) hours as needed for severe pain. 12/22/18   Caccavale, Sophia, PA-C  ibuprofen (ADVIL,MOTRIN) 200 MG tablet Take 400 mg by mouth every 6 (six) hours as needed for moderate pain.    [provider]  naproxen (NAPROSYN) 500 MG tablet Take 1 tablet (500 mg total) by mouth 2 (two) times daily. 12/23/18   Carlyle BasquesHernandez, Sharine Cadle P, PA-C  polyethylene glycol (MIRALAX / GLYCOLAX) packet Take 17 g by mouth daily. Patient not taking: Reported on 12/22/2018 04/10/16   Ashok Norrisiebock, Emina, NP  sulfamethoxazole-trimethoprim (BACTRIM DS,SEPTRA DS) 800-160 MG tablet Take 1 tablet by mouth 2 (two) times daily for 7 days. 12/22/18 12/29/18  Caccavale, Sophia, PA-C    Family History Family History  Problem Relation Age of Onset  . Hypertension Mother     Social History Social History   Tobacco Use  . Smoking status: Never Smoker  .  Smokeless tobacco: Never Used  Substance Use Topics  . Alcohol use: No  . Drug use: No     Allergies   Bactrim [sulfamethoxazole-trimethoprim] and Keflex [cephalexin]   Review of Systems Review of Systems  Constitutional: Negative for chills, diaphoresis and fever.  HENT: Positive for facial swelling. Negative for congestion, drooling, rhinorrhea, trouble swallowing and voice change.   Eyes: Negative for pain,  redness, itching and visual disturbance.  Respiratory: Negative for cough, choking, chest tightness, shortness of breath, wheezing and stridor.   Cardiovascular: Negative for chest pain.  Gastrointestinal: Negative for abdominal pain, nausea and vomiting.  Endocrine: Negative for cold intolerance and heat intolerance.  Musculoskeletal: Negative for gait problem and neck pain.  Skin: Positive for rash and wound.  Allergic/Immunologic: Negative for immunocompromised state.  Hematological: Negative for adenopathy.     Physical Exam Updated Vital Signs BP (!) 141/77   Pulse 92   Temp 98.5 F (36.9 C) (Oral)   Resp 16   SpO2 97%   Physical Exam Vitals signs and nursing note reviewed.  Constitutional:      General: He is not in acute distress.    Appearance: He is well-developed. He is not diaphoretic.  HENT:     Head: Normocephalic and atraumatic.     Comments: No facial edema noted on exam.    Nose: Nose normal. No congestion or rhinorrhea.     Mouth/Throat:     Mouth: Mucous membranes are moist.     Pharynx: Oropharynx is clear. No oropharyngeal exudate or posterior oropharyngeal erythema.  Eyes:     General:        Right eye: No discharge.        Left eye: No discharge.     Extraocular Movements: Extraocular movements intact.     Conjunctiva/sclera: Conjunctivae normal.     Pupils: Pupils are equal, round, and reactive to light.  Neck:     Musculoskeletal: Normal range of motion and neck supple.  Cardiovascular:     Rate and Rhythm: Normal rate and regular rhythm.     Heart sounds: Normal heart sounds. No murmur. No friction rub. No gallop.   Pulmonary:     Effort: Pulmonary effort is normal. No respiratory distress.     Breath sounds: Normal breath sounds. No wheezing or rales.  Abdominal:     Palpations: Abdomen is soft.     Tenderness: There is no abdominal tenderness.  Musculoskeletal: Normal range of motion.  Skin:    General: Skin is warm.     Findings:  Abscess, erythema and rash present. Rash is urticarial.       Neurological:     Mental Status: He is alert and oriented to person, place, and time.    ED Treatments / Results  Labs (all labs ordered are listed, but only abnormal results are displayed) Labs Reviewed - No data to display  EKG None  Radiology Ct Pelvis W Contrast  Result Date: 12/22/2018 CLINICAL DATA:  Fever and rectal pain. EXAM: CT PELVIS WITH CONTRAST TECHNIQUE: Multidetector CT imaging of the pelvis was performed using the standard protocol following the bolus administration of intravenous contrast. CONTRAST:  100 mL ISOVUE-300 IOPAMIDOL (ISOVUE-300) INJECTION 61% COMPARISON:  CT scan of Apr 07, 2016. FINDINGS: Urinary Tract:  No abnormality visualized. Bowel:  Unremarkable visualized pelvic bowel loops. Vascular/Lymphatic: No pathologically enlarged lymph nodes. No significant vascular abnormality seen. Reproductive:  No mass or other significant abnormality Other: 2.4 x 1.6 cm fluid collection is seen  in the medial portion of the right gluteal region concerning for abscess. Mild surrounding inflammatory changes are noted concerning for cellulitis. Musculoskeletal: No suspicious bone lesions identified. IMPRESSION: 2.4 x 1.6 cm fluid collection is seen in the medial portion of the right gluteal and buttocks region concerning for abscess. Electronically Signed   By: Lupita Raider, M.D.   On: 12/22/2018 20:35    Procedures Procedures (including critical care time)  Medications Ordered in ED Medications  diphenhydrAMINE (BENADRYL) capsule 50 mg (50 mg Oral Given 12/23/18 1949)  famotidine (PEPCID) tablet 20 mg (20 mg Oral Given 12/23/18 1950)     Initial Impression / Assessment and Plan / ED Course  I have reviewed the triage vital signs and the nursing notes.  Pertinent labs & imaging results that were available during my care of the patient were reviewed by me and considered in my medical decision making (see chart  for details).  Clinical Course as of Dec 24 2243  Thu Dec 23, 2018  2236 Symptoms have improved while in the ER.    [AH]    Clinical Course User Index [AH] Leretha Dykes, [place]   Patient presents with a rash on chest likely due to an allergic reaction to recent antibiotics. Patient was given benadryl and Pepcid with improvement. Patient re-evaluated prior to dc, is hemodynamically stable, in no respiratory distress, and denies the feeling of throat closing. Pt has been advised to take OTC benadryl & return to the ED if they have a mod-severe allergic rxn (s/s including throat closing, difficulty breathing, swelling of lips face or tongue). Changed antibiotics to Clindamycin due to allergic reaction. Patient continues to refuse I&D. Discussed risks of refusing I&D. Discussed discontinuing antibiotics and pain medicine prescribed yesterday due to allergic reaction. Discussed strict return precautions with patient. Pt is to follow up with their PCP. Prescribed naproxen for pain control for abscess. Pt is agreeable with plan & verbalizes understanding.   Final Clinical Impressions(s) / ED Diagnoses   Final diagnoses:  Allergic reaction, initial encounter  Rash    ED Discharge Orders         Ordered    clindamycin (CLEOCIN) 150 MG capsule  Every 6 hours     12/23/18 2236    naproxen (NAPROSYN) 500 MG tablet  2 times daily     12/23/18 2236    famotidine (PEPCID) 20 MG tablet  2 times daily     12/23/18 2241           Carlyle Basques [place], [place] 12/23/18 2246    Cathren Laine, MD 12/30/18 1410

## 2018-12-23 NOTE — Discharge Instructions (Addendum)
You have been seen today for an allergic reaction. Please read and follow all provided instructions.   1. Medications: Benadryl for rash, Pepcid for rash, usual home medications 2. Treatment: rest, drink plenty of fluids 3. Follow Up: Please follow up with your primary doctor in 2 days for discussion of your diagnoses and further evaluation after today's visit; if you do not have a primary care doctor use the resource guide provided to find one; Please return to the ER for any new or worsening symptoms. Please obtain all of your results from medical records or have your doctors office obtain the results - share them with your doctor - you should be seen at your doctors office. Call today to arrange your follow up.   Take medications as prescribed. Please review all of the medicines and only take them if you do not have an allergy to them. Return to the emergency room for worsening condition or new concerning symptoms. Follow up with your regular doctor. If you don't have a regular doctor use one of the numbers below to establish a primary care doctor.  Please be aware that if you are taking birth control pills, taking other prescriptions, ESPECIALLY ANTIBIOTICS may make the birth control ineffective - if this is the case, either do not engage in sexual activity or use alternative methods of birth control such as condoms until you have finished the medicine and your family doctor says it is OK to restart them. If you are on a blood thinner such as COUMADIN, be aware that any other medicine that you take may cause the coumadin to either work too much, or not enough - you should have your coumadin level rechecked in next 7 days if this is the case.  ?  It is also a possibility that you have an allergic reaction to any of the medicines that you have been prescribed - Everybody reacts differently to medications and while MOST people have no trouble with most medicines, you may have a reaction such as nausea,  vomiting, rash, swelling, shortness of breath. If this is the case, please stop taking the medicine immediately and contact your physician.  ?  You should return to the ER if you develop severe or worsening symptoms.   Emergency Department Resource Guide 1) Find a Doctor and Pay Out of Pocket Although you won't have to find out who is covered by your insurance plan, it is a good idea to ask around and get recommendations. You will then need to call the office and see if the doctor you have chosen will accept you as a new patient and what types of options they offer for patients who are self-pay. Some doctors offer discounts or will set up payment plans for their patients who do not have insurance, but you will need to ask so you aren't surprised when you get to your appointment.  2) Contact Your Local Health Department Not all health departments have doctors that can see patients for sick visits, but many do, so it is worth a call to see if yours does. If you don't know where your local health department is, you can check in your phone book. The CDC also has a tool to help you locate your state's health department, and many state websites also have listings of all of their local health departments.  3) Find a Walk-in Clinic If your illness is not likely to be very severe or complicated, you may want to try a walk in  clinic. These are popping up all over the country in pharmacies, drugstores, and shopping centers. They're usually staffed by nurse practitioners or physician assistants that have been trained to treat common illnesses and complaints. They're usually fairly quick and inexpensive. However, if you have serious medical issues or chronic medical problems, these are probably not your best option.  No Primary Care Doctor: Call Health Connect at  [phone] - they can help you locate a primary care doctor that  accepts your insurance, provides certain services, etc. Physician Referral [phone]  Emergency Department Resource Guide 1) Find a Doctor and Pay Out of Pocket Although you won't have to find out who is covered by your insurance plan, it is a good idea to ask around and get recommendations. You will then need to call the office and see if the doctor you have chosen will accept you as a new patient and what types of options they offer for patients who are self-pay. Some doctors offer discounts or will set up payment plans for their patients who do not have insurance, but you will need to ask so you aren't surprised when you get to your appointment.  2) Contact Your Local Health Department Not all health departments have doctors that can see patients for sick visits, but many do, so it is worth a call to see if yours does. If you don't know where your local health department is, you can check in your phone book. The CDC also has a tool to help you locate your state's health department, and many state websites also have listings of all of their local health departments.  3) Find a [facility] If your illness is not likely to be very severe or complicated, you may want to try a walk in clinic. These are popping up all over the country in pharmacies, drugstores, and shopping centers. They're usually staffed by nurse practitioners or physician assistants that have been trained to treat common illnesses and complaints. They're usually fairly quick and inexpensive. However, if you have serious medical issues or chronic medical problems, these are probably not your best option.  No Primary Care Doctor: Call Health Connect at  [phone] - they can help you locate a primary care doctor that  accepts your insurance, provides certain services, etc. Physician Referral Service- [phone]  Chronic Pain Problems: Organization         Address  Phone   Notes  [facility]  [phone] Patients need to be referred by their primary care doctor.    Medication Assistance: Organization         Address  Phone   Notes  [facility] Medication [facility] [address] [place], [place] [address] [phone] --Must be a resident of [facility] -- Must have NO insurance coverage whatsoever (no Medicaid/ Medicare, etc.) -- The pt. MUST have a primary care doctor that directs their care regularly and follows them in the community   [facility]  [phone]   [employer]  [phone]    Agencies that provide inexpensive medical care: Organization         Address  Phone   Notes  [place]  [phone]   [facility]    [phone]   [facility] [place], [place] [address] [phone]   [place] [address] [place] [phone]   [facility]    (  [phone]   [facility]    [phone]   [facility]  [address], [place] Phone:  [phone], Fax:  [phone] Hours of Operation:  9 am - 6 pm, M-F.  Also accepts Medicaid/Medicare and self-pay.  [facility] for [place] [address], [address], [place] Phone: [phone], Fax: [phone]. Hours of Operation:  8:30 am - 5:30 pm, M-F.  Also accepts Medicaid and self-pay.  [facility] [place] [address], [place] Phone: [phone]   [place], [place], [place] [phone], Ext. [phone] Mondays & Thursdays: 7-9 AM.  First 15 patients are seen on a first come, first serve basis.    [place] Providers:  Organization         Address  Phone   Notes  [facility] [address], [place] [phone] Also accepts self-pay patients.  [facility] [address], [place]  [phone]   [place], [address], [place] [phone]   [facility] [address] [place] [phone]   Lucianne Lei [address], [place]   [phone] Only accepts [place] [facility] [place] patients after they have their name applied to their card.   Self-Pay (no insurance) in [facility]:  Organization         Address  Phone   Notes  Sickle Cell Patients, [facility] [place] [phone]   [facility] [place] [phone]   [facility] [place]  [place], [place], [place] [phone]   [facility]/Dr. Osei-Bonsu  [address], [place] or [address], [place] [phone] Phone number for both [place] and [place] locations is the same.  [facility] [address], [place] [phone]   [facility] [address] [place] or [address] [phone]   [facility] [address] [place] [phone], phone; [phone], fax Sees patients 1st and 3rd Saturday of every month.  Must not qualify for public or private insurance (i.e. Medicaid, Medicare, [place] [employer] Choice, Veterans' Benefits)  Household income should be no more than 200% of the poverty level The clinic cannot treat you if you are pregnant or think you are pregnant  Sexually transmitted diseases are not treated at the clinic.

## 2018-12-23 NOTE — ED Triage Notes (Signed)
Pt endorses starting 2 antibiotics keflex and sulfamethoxazole for abscess to his buttocks that was diagnosed yesterday. Pt began having generalized rash with itching to chest. No breathing difficulty, airway intact. VSS

## 2020-08-12 IMAGING — CT CT PELVIS W/ CM
2 of 5 series · 16 of 46 positions shown, 19 images · IV contrast (ISOVUE)
Comparison: CT scan of April 07, 2016.

CLINICAL DATA: Fever and rectal pain.

EXAM:
CT PELVIS WITH CONTRAST
TECHNIQUE: Multidetector CT imaging of the pelvis was performed using the
standard protocol following the bolus administration of intravenous
contrast.
CONTRAST:  100 mL [id number] IOPAMIDOL (C61G9C-9II) INJECTION 61%

[Series 3: axial st · axial · 0.90mm/px · z∈[-650,-358]mm · 13 of 163 slices shown, 16 images]
[im 11/163  soft-tissue]
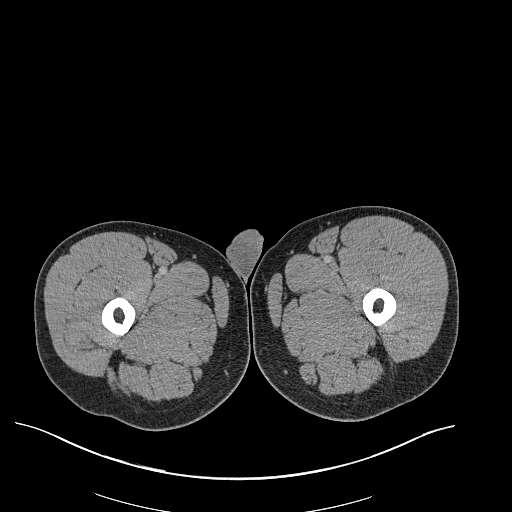
[im 11/163  bone]
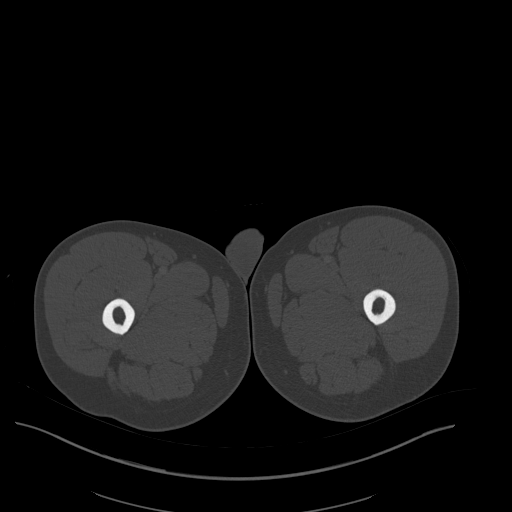
[im 27/163  soft-tissue]
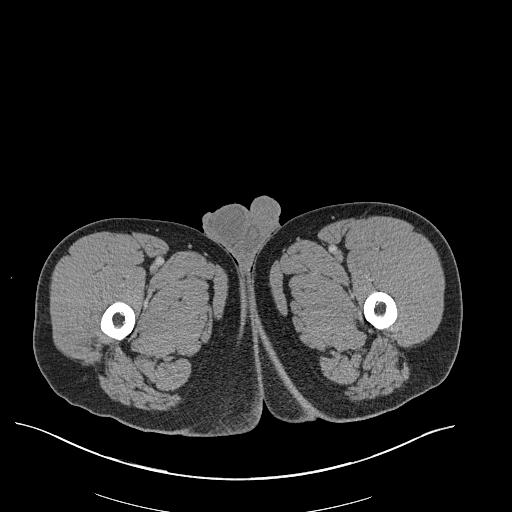
[im 42/163  soft-tissue]
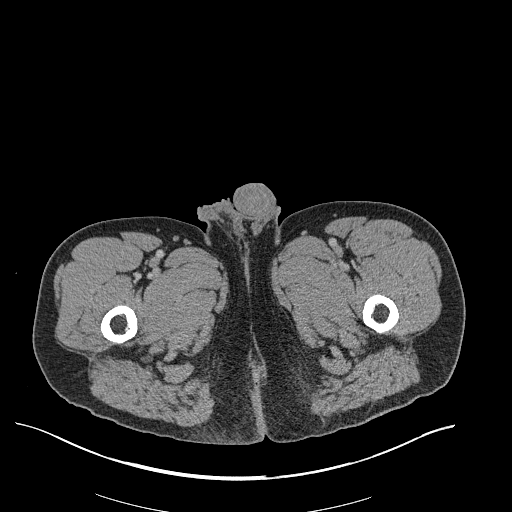
[im 58/163  soft-tissue]
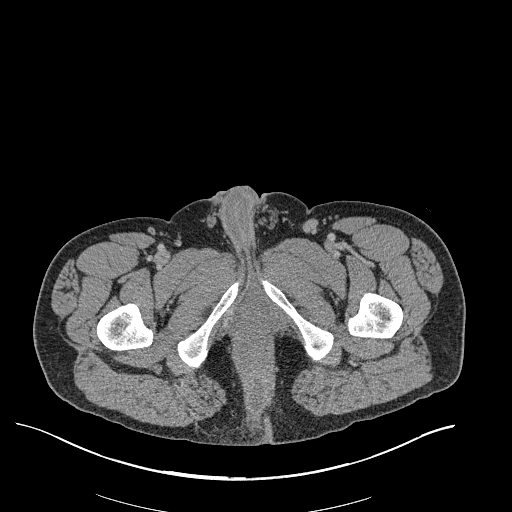
[im 74/163  soft-tissue]
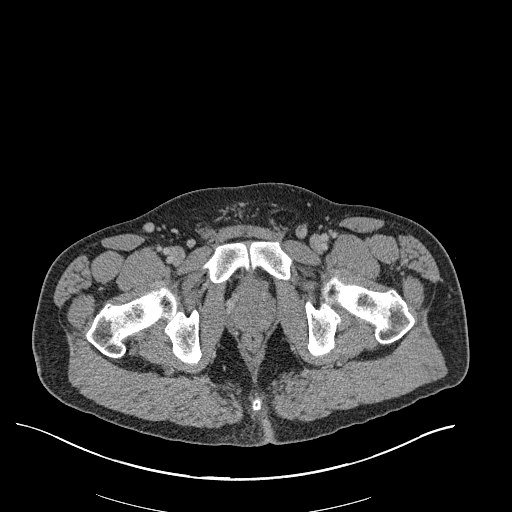
[im 89/163  soft-tissue]
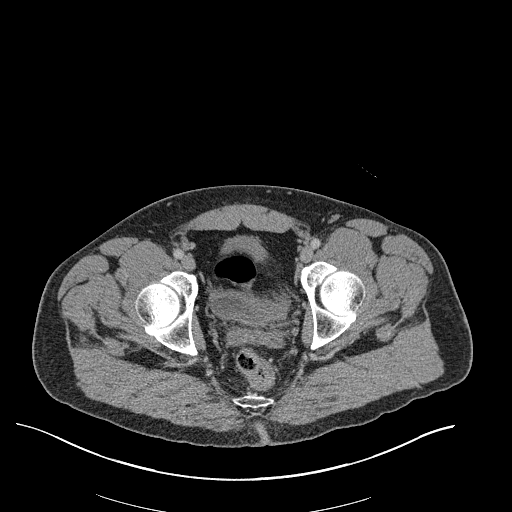
[im 105/163  soft-tissue]
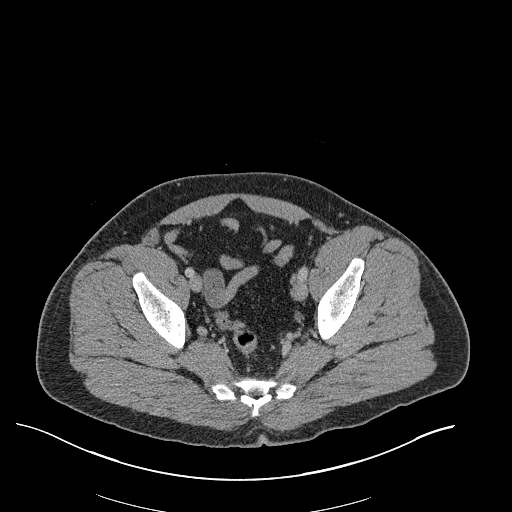
[im 121/163  soft-tissue]
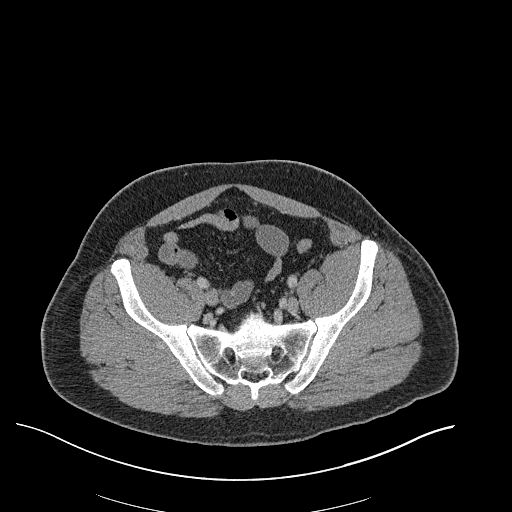
[im 136/163  soft-tissue]
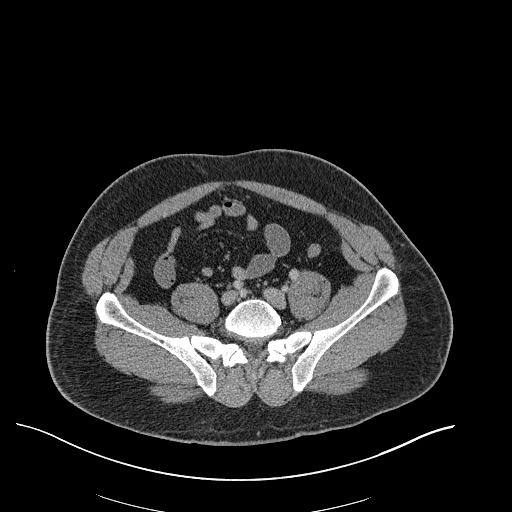
[im 136/163  bone]
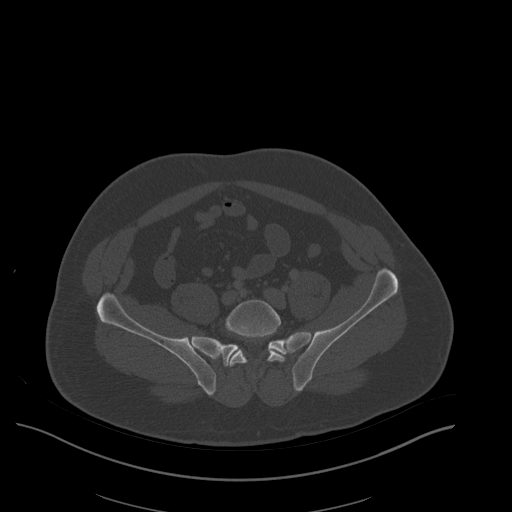
[im 142/163  lung]
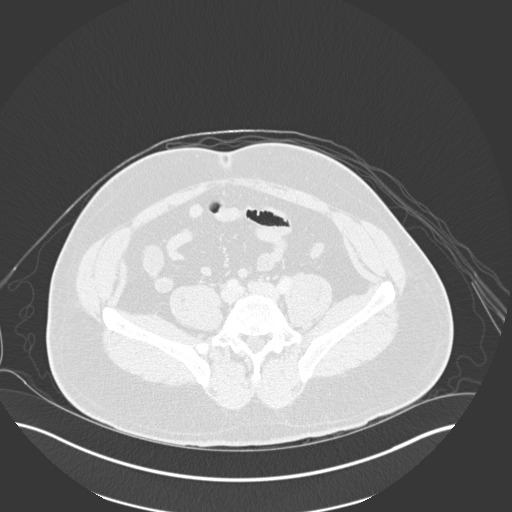
[im 147/163  lung]
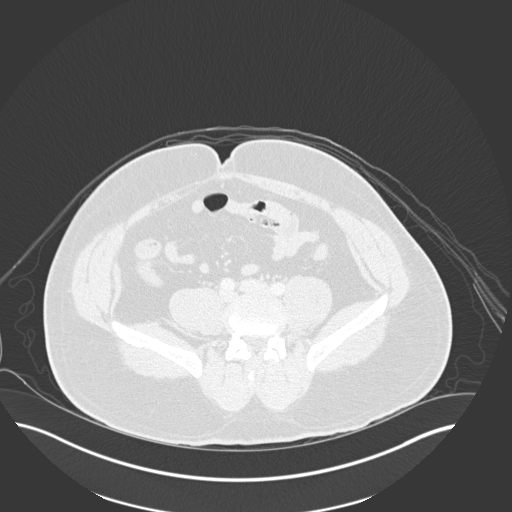
[im 152/163  soft-tissue]
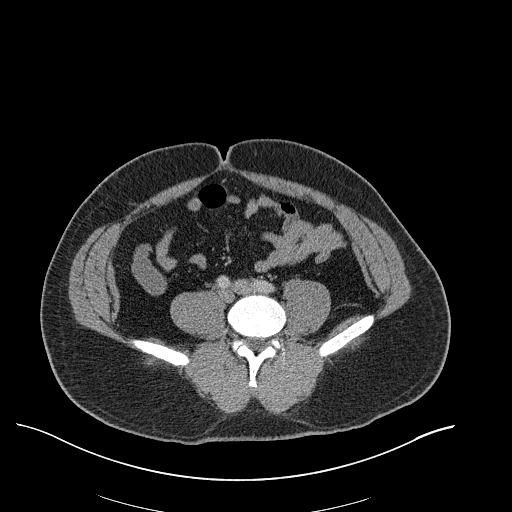
[im 152/163  lung]
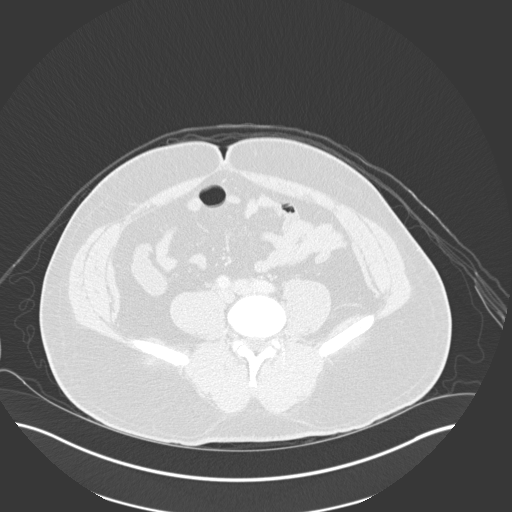
[im 157/163  lung]
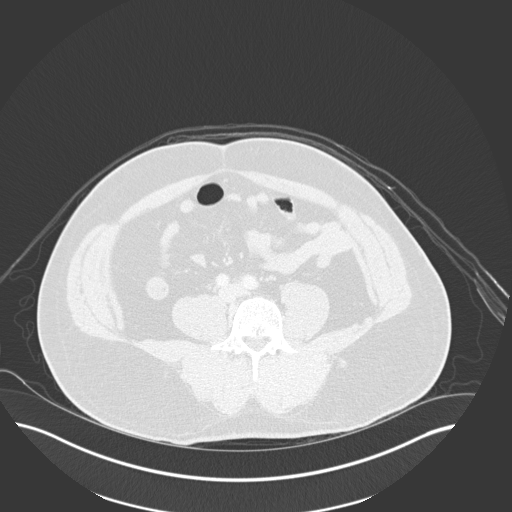

[Series 8: coronal st · coronal · 0.69mm/px · 3 of 144 slices shown]
[im 29/144  soft-tissue]
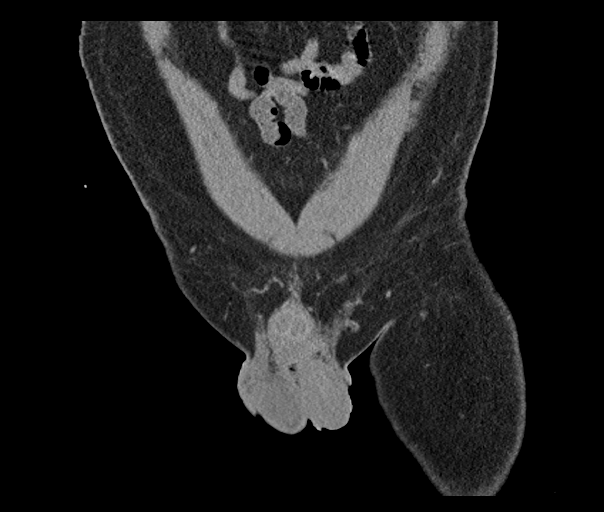
[im 58/144  soft-tissue]
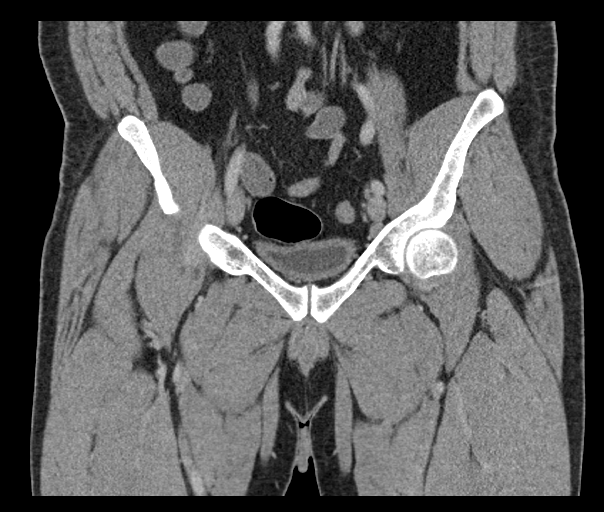
[im 86/144  soft-tissue]
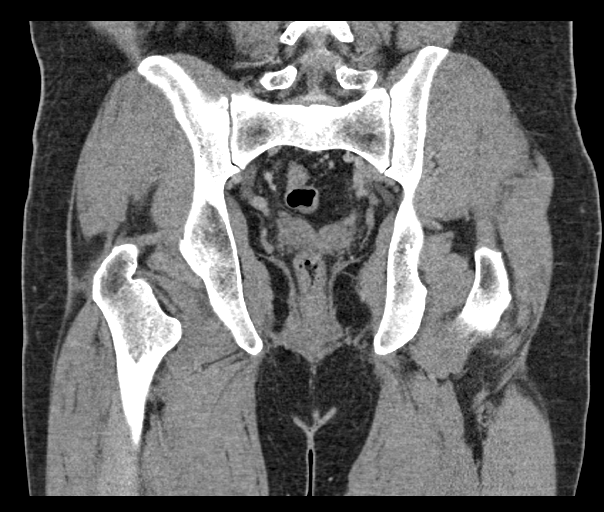

[16 of 46 positions shown; findings below may reference images not displayed]

FINDINGS: Urinary Tract:  No abnormality visualized.

Bowel:  Unremarkable visualized pelvic bowel loops.

Vascular/Lymphatic: No pathologically enlarged lymph nodes. No
significant vascular abnormality seen.

Reproductive:  No mass or other significant abnormality

Other: 2.4 x 1.6 cm fluid collection is seen in the medial portion
of the right gluteal region concerning for abscess. Mild surrounding
inflammatory changes are noted concerning for cellulitis.

Musculoskeletal: No suspicious bone lesions identified.
IMPRESSION: 2.4 x 1.6 cm fluid collection is seen in the medial portion of the
right gluteal and buttocks region concerning for abscess.
